# Patient Record
Sex: Female | Born: 1945 | Race: White | Hispanic: No | Smoking: Never smoker
Health system: Southern US, Community
[De-identification: ages and names within clinical notes are randomized; demographics above are authoritative.]

## PROBLEM LIST (undated history)

## (undated) DIAGNOSIS — K219 Gastro-esophageal reflux disease without esophagitis: Secondary | ICD-10-CM

## (undated) DIAGNOSIS — M4316 Spondylolisthesis, lumbar region: Secondary | ICD-10-CM

## (undated) DIAGNOSIS — R112 Nausea with vomiting, unspecified: Secondary | ICD-10-CM

## (undated) DIAGNOSIS — N8110 Cystocele, unspecified: Secondary | ICD-10-CM

## (undated) DIAGNOSIS — M1990 Unspecified osteoarthritis, unspecified site: Secondary | ICD-10-CM

## (undated) DIAGNOSIS — F419 Anxiety disorder, unspecified: Secondary | ICD-10-CM

## (undated) DIAGNOSIS — E7800 Pure hypercholesterolemia, unspecified: Secondary | ICD-10-CM

## (undated) DIAGNOSIS — I10 Essential (primary) hypertension: Secondary | ICD-10-CM

## (undated) DIAGNOSIS — Z9889 Other specified postprocedural states: Secondary | ICD-10-CM

## (undated) DIAGNOSIS — I2510 Atherosclerotic heart disease of native coronary artery without angina pectoris: Secondary | ICD-10-CM

## (undated) DIAGNOSIS — K8050 Calculus of bile duct without cholangitis or cholecystitis without obstruction: Secondary | ICD-10-CM

## (undated) HISTORY — PX: ERCP: SHX60

## (undated) HISTORY — PX: OTHER SURGICAL HISTORY: SHX169

## (undated) HISTORY — PX: LAMINECTOMY: SHX219

## (undated) HISTORY — PX: COLONOSCOPY: SHX174

## (undated) HISTORY — PX: ABDOMINAL HYSTERECTOMY: SHX81

## (undated) HISTORY — PX: ESOPHAGEAL DILATION: SHX303

## (undated) HISTORY — PX: TUBAL LIGATION: SHX77

## (undated) HISTORY — PX: VEIN LIGATION AND STRIPPING: SHX2653

## (undated) HISTORY — PX: BACK SURGERY: SHX140

## (undated) HISTORY — PX: ESOPHAGOGASTRODUODENOSCOPY: SHX1529

---

## 2001-10-26 ENCOUNTER — Inpatient Hospital Stay (HOSPITAL_COMMUNITY): Admission: EM | Admit: 2001-10-26 | Discharge: 2001-10-28 | Payer: Self-pay | Admitting: Cardiology

## 2005-12-28 ENCOUNTER — Ambulatory Visit: Payer: Self-pay | Admitting: Internal Medicine

## 2007-01-24 ENCOUNTER — Ambulatory Visit: Payer: Self-pay | Admitting: Internal Medicine

## 2008-01-25 ENCOUNTER — Ambulatory Visit: Payer: Self-pay | Admitting: Internal Medicine

## 2008-09-11 ENCOUNTER — Inpatient Hospital Stay: Payer: Self-pay | Admitting: Internal Medicine

## 2008-09-26 ENCOUNTER — Emergency Department: Payer: Self-pay | Admitting: Emergency Medicine

## 2008-09-27 ENCOUNTER — Ambulatory Visit: Payer: Self-pay | Admitting: Emergency Medicine

## 2008-10-09 ENCOUNTER — Inpatient Hospital Stay: Payer: Self-pay | Admitting: Surgery

## 2008-11-06 ENCOUNTER — Ambulatory Visit: Payer: Self-pay | Admitting: Internal Medicine

## 2008-11-30 ENCOUNTER — Inpatient Hospital Stay: Payer: Self-pay | Admitting: Internal Medicine

## 2009-01-03 ENCOUNTER — Inpatient Hospital Stay: Payer: Self-pay | Admitting: Internal Medicine

## 2009-01-13 ENCOUNTER — Inpatient Hospital Stay: Payer: Self-pay | Admitting: Internal Medicine

## 2009-01-13 ENCOUNTER — Ambulatory Visit: Payer: Self-pay | Admitting: Gastroenterology

## 2009-03-21 ENCOUNTER — Ambulatory Visit: Payer: Self-pay | Admitting: Internal Medicine

## 2009-11-05 ENCOUNTER — Ambulatory Visit: Payer: Self-pay | Admitting: Unknown Physician Specialty

## 2009-11-06 LAB — PATHOLOGY REPORT

## 2010-03-24 ENCOUNTER — Ambulatory Visit: Payer: Self-pay | Admitting: Internal Medicine

## 2010-03-27 ENCOUNTER — Ambulatory Visit: Payer: Self-pay | Admitting: Internal Medicine

## 2011-03-26 ENCOUNTER — Ambulatory Visit: Payer: Self-pay | Admitting: Internal Medicine

## 2012-03-27 ENCOUNTER — Ambulatory Visit: Payer: Self-pay | Admitting: Internal Medicine

## 2012-04-01 ENCOUNTER — Ambulatory Visit: Payer: Self-pay | Admitting: Physical Medicine and Rehabilitation

## 2012-04-01 LAB — CREATININE, SERUM
Creatinine: 0.85 mg/dL (ref 0.60–1.30)
EGFR (African American): >60

## 2013-03-28 ENCOUNTER — Ambulatory Visit: Payer: Self-pay | Admitting: Internal Medicine

## 2014-03-29 ENCOUNTER — Ambulatory Visit: Payer: Self-pay | Admitting: Internal Medicine

## 2014-12-06 ENCOUNTER — Encounter: Payer: Self-pay | Admitting: *Deleted

## 2014-12-09 ENCOUNTER — Ambulatory Visit: Payer: Medicare Other | Admitting: Anesthesiology

## 2014-12-09 ENCOUNTER — Ambulatory Visit
Admission: RE | Admit: 2014-12-09 | Discharge: 2014-12-09 | Disposition: A | Discharge status: Home / Self Care | Payer: Medicare Other | Source: Ambulatory Visit | Attending: Unknown Physician Specialty | Admitting: Unknown Physician Specialty

## 2014-12-09 ENCOUNTER — Encounter
Admission: RE | Disposition: A | Discharge status: Home / Self Care | Payer: Self-pay | Source: Ambulatory Visit | Attending: Unknown Physician Specialty

## 2014-12-09 ENCOUNTER — Encounter: Payer: Self-pay | Admitting: Anesthesiology

## 2014-12-09 DIAGNOSIS — K219 Gastro-esophageal reflux disease without esophagitis: Secondary | ICD-10-CM | POA: Insufficient documentation

## 2014-12-09 DIAGNOSIS — I2510 Atherosclerotic heart disease of native coronary artery without angina pectoris: Secondary | ICD-10-CM | POA: Diagnosis not present

## 2014-12-09 DIAGNOSIS — Z7982 Long term (current) use of aspirin: Secondary | ICD-10-CM | POA: Diagnosis not present

## 2014-12-09 DIAGNOSIS — D122 Benign neoplasm of ascending colon: Secondary | ICD-10-CM | POA: Diagnosis not present

## 2014-12-09 DIAGNOSIS — Z86010 Personal history of colonic polyps: Secondary | ICD-10-CM | POA: Insufficient documentation

## 2014-12-09 DIAGNOSIS — K640 First degree hemorrhoids: Secondary | ICD-10-CM | POA: Diagnosis not present

## 2014-12-09 DIAGNOSIS — Z90710 Acquired absence of both cervix and uterus: Secondary | ICD-10-CM | POA: Insufficient documentation

## 2014-12-09 DIAGNOSIS — E780 Pure hypercholesterolemia: Secondary | ICD-10-CM | POA: Diagnosis not present

## 2014-12-09 DIAGNOSIS — D123 Benign neoplasm of transverse colon: Secondary | ICD-10-CM | POA: Diagnosis not present

## 2014-12-09 DIAGNOSIS — N8110 Cystocele, unspecified: Secondary | ICD-10-CM | POA: Diagnosis not present

## 2014-12-09 DIAGNOSIS — K8050 Calculus of bile duct without cholangitis or cholecystitis without obstruction: Secondary | ICD-10-CM | POA: Diagnosis not present

## 2014-12-09 DIAGNOSIS — Z79899 Other long term (current) drug therapy: Secondary | ICD-10-CM | POA: Diagnosis not present

## 2014-12-09 DIAGNOSIS — I10 Essential (primary) hypertension: Secondary | ICD-10-CM | POA: Diagnosis not present

## 2014-12-09 DIAGNOSIS — F419 Anxiety disorder, unspecified: Secondary | ICD-10-CM | POA: Diagnosis not present

## 2014-12-09 HISTORY — PX: COLONOSCOPY WITH PROPOFOL: SHX5780

## 2014-12-09 HISTORY — DX: Pure hypercholesterolemia, unspecified: E78.00

## 2014-12-09 HISTORY — DX: Atherosclerotic heart disease of native coronary artery without angina pectoris: I25.10

## 2014-12-09 HISTORY — DX: Cystocele, unspecified: N81.10

## 2014-12-09 HISTORY — DX: Anxiety disorder, unspecified: F41.9

## 2014-12-09 HISTORY — DX: Essential (primary) hypertension: I10

## 2014-12-09 HISTORY — DX: Calculus of bile duct without cholangitis or cholecystitis without obstruction: K80.50

## 2014-12-09 HISTORY — DX: Gastro-esophageal reflux disease without esophagitis: K21.9

## 2014-12-09 SURGERY — COLONOSCOPY WITH PROPOFOL
Anesthesia: General

## 2014-12-09 MED ORDER — PROPOFOL INFUSION 10 MG/ML OPTIME
INTRAVENOUS | Status: DC | PRN
  Administered 2014-12-09: 160 ug/kg/min via INTRAVENOUS

## 2014-12-09 MED ORDER — FENTANYL CITRATE (PF) 100 MCG/2ML IJ SOLN
INTRAMUSCULAR | Status: DC | PRN
  Administered 2014-12-09: 50 ug via INTRAVENOUS

## 2014-12-09 MED ORDER — PROPOFOL 10 MG/ML IV BOLUS
INTRAVENOUS | Status: DC | PRN
  Administered 2014-12-09: 50 mg via INTRAVENOUS

## 2014-12-09 MED ORDER — SODIUM CHLORIDE 0.9 % IV SOLN: INTRAVENOUS | Status: DC

## 2014-12-09 MED ORDER — LIDOCAINE HCL (CARDIAC) 20 MG/ML IV SOLN
INTRAVENOUS | Status: DC | PRN
Start: 2014-12-09 — End: 2014-12-09
  Administered 2014-12-09: 30 mg via INTRAVENOUS

## 2014-12-09 MED ORDER — SODIUM CHLORIDE 0.9 % IV SOLN
INTRAVENOUS | Status: DC
  Administered 2014-12-09 (×2): via INTRAVENOUS

## 2014-12-09 MED ORDER — MIDAZOLAM HCL 5 MG/5ML IJ SOLN
INTRAMUSCULAR | Status: DC | PRN
  Administered 2014-12-09: 1 mg via INTRAVENOUS

## 2014-12-09 NOTE — Op Note (Signed)
[facility] Gastroenterology Patient Name: Michele Mcgee Procedure Date: 12/09/2014 2:18 PM MRN: [id number] Account #: [id number] Date of Birth: 1945/04/15 Admit Type: Outpatient Age: 69 Room: [facility] ENDO ROOM 1 Gender: Female Note Status: Finalized Procedure:         Colonoscopy Indications:       Personal history of colonic polyps Providers:         Manya Silvas, MD Referring MD:      Ocie Cornfield. Ouida Sills, MD (Referring MD) Medicines:         Propofol per Anesthesia Complications:     No immediate complications. Procedure:         Pre-Anesthesia Assessment:                    - After reviewing the risks and benefits, the patient was                     deemed in satisfactory condition to undergo the procedure.                    After obtaining informed consent, the colonoscope was                     passed under direct vision. Throughout the procedure, the                     patient's blood pressure, pulse, and oxygen saturations                     were monitored continuously. The Colonoscope was                     introduced through the anus and advanced to the the cecum,                     identified by appendiceal orifice and ileocecal valve. The                     colonoscopy was performed without difficulty. The patient                     tolerated the procedure well. The quality of the bowel                     preparation was good. Findings:      A small polyp was found in the ascending colon. The polyp was sessile.       The polyp was removed with a hot snare. Resection and retrieval were       complete. To prevent bleeding after the polypectomy, two hemostatic       clips were successfully placed.      A diminutive polyp was found in the ascending colon. The polyp was       sessile. The polyp was removed with a jumbo cold forceps. Resection and       retrieval were complete. Biopsies were taken with a cold forceps for       histology.  A small polyp was found at the hepatic flexure. The polyp was sessile.       The polyp was removed with a hot snare. Resection and retrieval were       complete.      A diminutive polyp was found in the transverse colon. The polyp was  sessile. Resection and retrieval were complete. Cold biopsy.      Internal hemorrhoids were found during endoscopy. The hemorrhoids were       small and Grade I (internal hemorrhoids that do not prolapse). Impression:        - One small polyp in the ascending colon. Resected and                     retrieved. Clips were placed.                    - One diminutive polyp in the ascending colon. Resected                     and retrieved. Biopsied.                    - One small polyp at the hepatic flexure. Resected and                     retrieved.                    - One diminutive polyp in the transverse colon. Resected                     and retrieved.                    - Internal hemorrhoids. Recommendation:    - Await pathology results. Manya Silvas, MD 12/09/2014 3:16:12 PM This report has been signed electronically. Number of Addenda: 0 Note Initiated On: 12/09/2014 2:18 PM Scope Withdrawal Time: 0 hours 23 minutes 20 seconds  Total Procedure Duration: 0 hours 28 minutes 0 seconds       [facility]

## 2014-12-09 NOTE — Anesthesia Postprocedure Evaluation (Signed)
  Anesthesia Post-op Note  Patient: Michele Mcgee  Procedure(s) Performed: Procedure(s): COLONOSCOPY WITH PROPOFOL (N/A)  Anesthesia type:General  Patient location: PACU  Post pain: Pain level controlled  Post assessment: Post-op Vital signs reviewed, Patient's Cardiovascular Status Stable, Respiratory Function Stable, Patent Airway and No signs of Nausea or vomiting  Post vital signs: Reviewed and stable  Last Vitals:  Filed Vitals:   12/09/14 1600  BP: 134/69  Pulse: 62  Temp:   Resp: 18    Level of consciousness: awake, alert  and patient cooperative  Complications: No apparent anesthesia complications

## 2014-12-09 NOTE — Anesthesia Preprocedure Evaluation (Addendum)
Anesthesia Evaluation  Patient identified by MRN, date of birth, ID band Patient awake    Reviewed: Allergy & Precautions, H&P , NPO status , Patient's Chart, lab work & pertinent test results, reviewed documented beta blocker date and time   Airway Mallampati: III  TM Distance: >3 FB Neck ROM: full    Dental no notable dental hx.    Pulmonary neg pulmonary ROS,  breath sounds clear to auscultation  Pulmonary exam normal       Cardiovascular Exercise Tolerance: Good hypertension, Pt. on medications and Pt. on home beta blockers + CAD negative cardio ROS  Rhythm:regular Rate:Normal     Neuro/Psych Anxiety negative neurological ROS  negative psych ROS   GI/Hepatic negative GI ROS, Neg liver ROS, GERD-  Medicated and Controlled,  Endo/Other  negative endocrine ROS  Renal/GU negative Renal ROS  negative genitourinary   Musculoskeletal   Abdominal   Peds  Hematology negative hematology ROS (+)   Anesthesia Other Findings Obesity.  Reproductive/Obstetrics negative OB ROS                          Anesthesia Physical Anesthesia Plan  ASA: III  Anesthesia Plan: General   Post-op Pain Management:    Induction:   Airway Management Planned: Nasal Cannula  Additional Equipment:   Intra-op Plan:   Post-operative Plan:   Informed Consent: I have reviewed the patients History and Physical, chart, labs and discussed the procedure including the risks, benefits and alternatives for the proposed anesthesia with the patient or authorized representative who has indicated his/her understanding and acceptance.   Dental Advisory Given  Plan Discussed with: CRNA  Anesthesia Plan Comments:        Anesthesia Quick Evaluation

## 2014-12-09 NOTE — Transfer of Care (Signed)
Immediate Anesthesia Transfer of Care Note  Patient: Michele Mcgee  Procedure(s) Performed: Procedure(s): COLONOSCOPY WITH PROPOFOL (N/A)  Patient Location: PACU and Short Stay  Anesthesia Type:General  Level of Consciousness: awake, oriented and patient cooperative  Airway & Oxygen Therapy: Patient Spontanous Breathing and Patient connected to nasal cannula oxygen  Post-op Assessment: Report given to RN and Post -op Vital signs reviewed and stable  Post vital signs: Reviewed and stable  Last Vitals:  Filed Vitals:   12/09/14 1518  BP: 0/0  Pulse: 73  Temp: 36.6 C  Resp:     Complications: No apparent anesthesia complications

## 2014-12-09 NOTE — H&P (Signed)
   Primary Care Physician:  Kirk Ruths., MD Primary Gastroenterologist:  Dr. Vira Agar  Pre-Procedure History & Physical: HPI:  Michele Mcgee is a 69 y.o. female is here for an colonoscopy.   Past Medical History  Diagnosis Date  . Hypercholesterolemia   . GERD (gastroesophageal reflux disease)   . Female bladder prolapse   . Coronary artery disease   . Choledocholithiasis   . Anxiety   . Hypertension     Past Surgical History  Procedure Laterality Date  . Abdominal hysterectomy    . Back surgery    . Esophageal dilation    . Laminectomy    . Vein ligation and stripping    . Bladder tacking surgery    . Colonoscopy    . Esophagogastroduodenoscopy    . Ercp      Prior to Admission medications   Medication Sig Start Date End Date Taking? Authorizing Provider  ALPRAZolam Duanne Moron) 0.25 MG tablet Take 0.25 mg by mouth at bedtime as needed for anxiety.   Yes Historical Provider, MD  aspirin 81 MG tablet Take 81 mg by mouth daily.   Yes Historical Provider, MD  escitalopram (LEXAPRO) 10 MG tablet Take 10 mg by mouth daily.   Yes Historical Provider, MD  esomeprazole (NEXIUM) 40 MG capsule Take 40 mg by mouth daily at 12 noon.   Yes Historical Provider, MD  lisinopril-hydrochlorothiazide (PRINZIDE,ZESTORETIC) 10-12.5 MG per tablet Take 1 tablet by mouth daily.   Yes Historical Provider, MD  metoprolol succinate (TOPROL-XL) 25 MG 24 hr tablet Take 25 mg by mouth daily.   Yes Historical Provider, MD    Allergies as of 11/11/2014  . (Not on File)    History reviewed. No pertinent family history.  Social History   Social History  . Marital Status: Widowed    Spouse Name: N/A  . Number of Children: N/A  . Years of Education: N/A   Occupational History  . Not on file.   Social History Main Topics  . Smoking status: Not on file  . Smokeless tobacco: Not on file  . Alcohol Use: Not on file  . Drug Use: Not on file  . Sexual Activity: Not on file   Other Topics  Concern  . Not on file   Social History Narrative    Review of Systems: See HPI, otherwise negative ROS  Physical Exam: BP 165/68 mmHg  Pulse 83  Temp(Src) 98.5 F (36.9 C) (Oral)  Resp 19  Ht 5\' 2"  (1.575 m)  Wt 107.049 kg (236 lb)  BMI 43.15 kg/m2  SpO2 98% General:   Alert,  pleasant and cooperative in NAD Head:  Normocephalic and atraumatic. Neck:  Supple; no masses or thyromegaly. Lungs:  Clear throughout to auscultation.    Heart:  Regular rate and rhythm. Abdomen:  Soft, nontender and nondistended. Normal bowel sounds, without guarding, and without rebound.   Neurologic:  Alert and  oriented x4;  grossly normal neurologically.  Impression/Plan: Michele Mcgee is here for an colonoscopy to be performed for Personal hx of colon polyps  Risks, benefits, limitations, and alternatives regarding  colonoscopy have been reviewed with the patient.  Questions have been answered.  All parties agreeable.   Gaylyn Cheers, MD  12/09/2014, 2:32 PM

## 2014-12-10 ENCOUNTER — Encounter: Payer: Self-pay | Admitting: Unknown Physician Specialty

## 2014-12-11 LAB — SURGICAL PATHOLOGY

## 2015-02-18 ENCOUNTER — Other Ambulatory Visit: Payer: Self-pay | Admitting: Internal Medicine

## 2015-02-18 DIAGNOSIS — Z1231 Encounter for screening mammogram for malignant neoplasm of breast: Secondary | ICD-10-CM

## 2015-03-31 ENCOUNTER — Ambulatory Visit
Admission: RE | Admit: 2015-03-31 | Discharge: 2015-03-31 | Disposition: A | Discharge status: Home / Self Care | Payer: Medicare Other | Source: Ambulatory Visit | Attending: Internal Medicine | Admitting: Internal Medicine

## 2015-03-31 DIAGNOSIS — Z1231 Encounter for screening mammogram for malignant neoplasm of breast: Secondary | ICD-10-CM | POA: Diagnosis present

## 2016-02-16 ENCOUNTER — Other Ambulatory Visit: Payer: Self-pay | Admitting: Internal Medicine

## 2016-02-16 DIAGNOSIS — Z1231 Encounter for screening mammogram for malignant neoplasm of breast: Secondary | ICD-10-CM

## 2016-03-31 ENCOUNTER — Ambulatory Visit: Payer: Medicare Other

## 2016-09-16 ENCOUNTER — Other Ambulatory Visit: Payer: Self-pay | Admitting: Physician Assistant

## 2016-09-16 ENCOUNTER — Ambulatory Visit
Admission: RE | Admit: 2016-09-16 | Discharge: 2016-09-16 | Disposition: A | Discharge status: Home / Self Care | Payer: Medicare Other | Source: Ambulatory Visit | Attending: Physician Assistant | Admitting: Physician Assistant

## 2016-09-16 DIAGNOSIS — M7989 Other specified soft tissue disorders: Secondary | ICD-10-CM | POA: Diagnosis not present

## 2016-09-16 DIAGNOSIS — R0602 Shortness of breath: Secondary | ICD-10-CM

## 2016-09-16 DIAGNOSIS — R600 Localized edema: Secondary | ICD-10-CM

## 2017-01-11 ENCOUNTER — Ambulatory Visit
Admission: RE | Admit: 2017-01-11 | Discharge: 2017-01-11 | Disposition: A | Discharge status: Home / Self Care | Payer: Medicare Other | Source: Ambulatory Visit | Attending: Internal Medicine | Admitting: Internal Medicine

## 2017-01-11 DIAGNOSIS — Z1231 Encounter for screening mammogram for malignant neoplasm of breast: Secondary | ICD-10-CM | POA: Diagnosis not present

## 2017-05-19 ENCOUNTER — Other Ambulatory Visit: Payer: Self-pay | Admitting: Orthopedic Surgery

## 2017-05-19 DIAGNOSIS — M48061 Spinal stenosis, lumbar region without neurogenic claudication: Secondary | ICD-10-CM

## 2017-05-20 ENCOUNTER — Ambulatory Visit
Admission: RE | Admit: 2017-05-20 | Discharge: 2017-05-20 | Disposition: A | Discharge status: Home / Self Care | Payer: Medicare Other | Source: Ambulatory Visit | Attending: Orthopedic Surgery | Admitting: Orthopedic Surgery

## 2017-05-20 DIAGNOSIS — M48061 Spinal stenosis, lumbar region without neurogenic claudication: Secondary | ICD-10-CM

## 2017-08-30 ENCOUNTER — Other Ambulatory Visit: Payer: Self-pay | Admitting: Neurosurgery

## 2017-09-08 NOTE — Pre-Procedure Instructions (Addendum)
ALLEY NEILS  09/08/2017      [id number] - [place], [place] - [address] [place] [address] Phone: [phone] Fax: [phone]    Your procedure is scheduled on Wed., September 14, 2017 from 10:30AM-2:18PM  Report to [facility] Admitting Entrance "A" at 8:30AM  Call this number if you have problems the morning of surgery:  [phone]   Remember:  No food or drinks after midnight on June 4th  Take these medicines the morning of surgery with A SIP OF WATER: Docusate sodium (COLACE), Escitalopram (LEXAPRO), Esomeprazole ([place]), Metoprolol succinate (TOPROL-XL), and Fluticasone (FLONASE).  Follow your doctors instructions regarding your Aspirin.  If no instructions were given by your doctor, then you will need to call the prescribing office office to get instructions.  As of today, stop taking all Other Aspirin Products, Vitamins, Fish oils, and Herbal medications. Also stop all NSAIDS i.e. Advil, Ibuprofen, Motrin, Aleve, Anaprox, Naproxen, BC and Goody Powders.    Do not wear jewelry, make-up or nail polish (fingers).  Do not wear lotions, powders, or perfumes, or deodorant.  Do not shave 48 hours prior to surgery.    Do not bring valuables to the hospital.  [facility] is not responsible for any belongings or valuables.  Contacts, dentures or bridgework may not be worn into surgery.  Leave your suitcase in the car.  After surgery it may be brought to your room.  For patients admitted to the hospital, discharge time will be determined by your treatment team.  Patients discharged the day of surgery will not be allowed to drive home.   Special instructions:   [place]- Preparing For Surgery  Before surgery, you can play an important role. Because skin is not sterile, your skin needs to be as free of germs as possible. You can reduce the number of germs on your skin by washing with CHG (chlorahexidine gluconate) Soap before surgery.   CHG is an antiseptic cleaner which kills germs and bonds with the skin to continue killing germs even after washing.    Oral Hygiene is also important to reduce your risk of infection.  Remember - BRUSH YOUR TEETH THE MORNING OF SURGERY WITH YOUR REGULAR TOOTHPASTE  Please do not use if you have an allergy to CHG or antibacterial soaps. If your skin becomes reddened/irritated stop using the CHG.  Do not shave (including legs and underarms) for at least 48 hours prior to first CHG shower. It is OK to shave your face.  Please follow these instructions carefully.   1. Shower the NIGHT BEFORE SURGERY and the MORNING OF SURGERY with CHG.   2. If you chose to wash your hair, wash your hair first as usual with your normal shampoo.  3. After you shampoo, rinse your hair and body thoroughly to remove the shampoo.  4. Use CHG as you would any other liquid soap. You can apply CHG directly to the skin and wash gently with a scrungie or a clean washcloth.   5. Apply the CHG Soap to your body ONLY FROM THE NECK DOWN.  Do not use on open wounds or open sores. Avoid contact with your eyes, ears, mouth and genitals (private parts). Wash Face and genitals (private parts)  with your normal soap.  6. Wash thoroughly, paying special attention to the area where your surgery will be performed.  7. Thoroughly rinse your body with warm water from the neck down.  8.  DO NOT shower/wash with your normal soap after using and rinsing off the CHG Soap.  9. Pat yourself dry with a CLEAN TOWEL.  10. Wear CLEAN PAJAMAS to bed the night before surgery, wear comfortable clothes the morning of surgery  11. Place CLEAN SHEETS on your bed the night of your first shower and DO NOT SLEEP WITH PETS.  Day of Surgery:  Do not apply any deodorants/lotions.  Please wear clean clothes to the hospital/surgery center.   Remember to brush your teeth WITH YOUR REGULAR TOOTHPASTE.  Please read over the following fact sheets that  you were given. Pain Booklet, Coughing and Deep Breathing, MRSA Information and Surgical Site Infection Prevention

## 2017-09-09 ENCOUNTER — Other Ambulatory Visit: Payer: Self-pay

## 2017-09-09 ENCOUNTER — Encounter (HOSPITAL_COMMUNITY)
Admission: RE | Admit: 2017-09-09 | Discharge: 2017-09-09 | Disposition: A | Discharge status: Home / Self Care | Payer: Medicare Other | Source: Ambulatory Visit | Attending: Neurosurgery | Admitting: Neurosurgery

## 2017-09-09 ENCOUNTER — Other Ambulatory Visit: Payer: Self-pay | Admitting: Neurosurgery

## 2017-09-09 ENCOUNTER — Encounter (HOSPITAL_COMMUNITY): Payer: Self-pay

## 2017-09-09 DIAGNOSIS — Z01812 Encounter for preprocedural laboratory examination: Secondary | ICD-10-CM | POA: Insufficient documentation

## 2017-09-09 HISTORY — DX: Spondylolisthesis, lumbar region: M43.16

## 2017-09-09 HISTORY — DX: Unspecified osteoarthritis, unspecified site: M19.90

## 2017-09-09 HISTORY — DX: Other specified postprocedural states: Z98.890

## 2017-09-09 HISTORY — DX: Nausea with vomiting, unspecified: R11.2

## 2017-09-09 LAB — CBC
HEMATOCRIT: 41.4 % (ref 36.0–46.0)
HEMOGLOBIN: 13.9 g/dL (ref 12.0–15.0)
MCH: 30.8 pg (ref 26.0–34.0)
MCHC: 33.6 g/dL (ref 30.0–36.0)
MCV: 91.6 fL (ref 78.0–100.0)
Platelets: 151 10*3/uL (ref 150–400)
RBC: 4.52 MIL/uL (ref 3.87–5.11)
RDW: 13.1 % (ref 11.5–15.5)
WBC: 5.5 10*3/uL (ref 4.0–10.5)

## 2017-09-09 LAB — BASIC METABOLIC PANEL
Anion gap: 6 (ref 5–15)
BUN: 15 mg/dL (ref 6–20)
CALCIUM: 8.9 mg/dL (ref 8.9–10.3)
CHLORIDE: 104 mmol/L (ref 101–111)
CO2: 31 mmol/L (ref 22–32)
Creatinine, Ser: 0.82 mg/dL (ref 0.44–1.00)
GFR calc Af Amer: >60 mL/min (ref >60)
GFR calc non Af Amer: >60 mL/min (ref >60)
GLUCOSE: 139 mg/dL — AB (ref 65–99)
POTASSIUM: 4 mmol/L (ref 3.5–5.1)
SODIUM: 141 mmol/L (ref 135–145)

## 2017-09-09 LAB — SURGICAL PCR SCREEN
MRSA, PCR: NEGATIVE
STAPHYLOCOCCUS AUREUS: NEGATIVE

## 2017-09-09 LAB — TYPE AND SCREEN
ABO/RH(D): A POS
Antibody Screen: NEGATIVE

## 2017-09-09 LAB — ABO/RH: ABO/RH(D): A POS

## 2017-09-09 NOTE — Progress Notes (Addendum)
PCP - Dr. Frazier Richards  Cardiologist - Dr. Ubaldo Glassing- Clearance note in chart  Chest x-ray - 09/16/16 (CE)  EKG - 09/21/16 (CE)-Requested  Stress Test - 09/30/16 (CE)  ECHO - 09/23/16 (CE)  Cardiac Cath - 2003- Stent x1  Sleep Study - Denies CPAP - None  LABS- 09/09/17: CBC, BMP, T/S  ASA- LD-5/30   Anesthesia- Yes- Cardiac history  Pt denies having chest pain, sob, or fever at this time. All instructions explained to the pt, with a verbal understanding of the material. Pt agrees to go over the instructions while at home for a better understanding. The opportunity to ask questions was provided.

## 2017-09-12 ENCOUNTER — Encounter (HOSPITAL_COMMUNITY): Payer: Self-pay

## 2017-09-12 NOTE — Progress Notes (Signed)
Anesthesia Chart Review:  Case:  [id number] Date/Time:  09/14/17 1015   Procedure:  POSTERIOR LUMBAR INTERBODY FUSION, INTERBODY PROSTHESIS, POSYTERIOR INSTRUMENTATION AND FUSION LUMBAR 3- LUMBAR 4, LUMBAR 2- LUMBAR 3 LAMINOTOMIES (N/A ) - POSTERIOR LUMBAR INTERBODY FUSION, INTERBODY PROSTHESIS, POSYTERIOR INSTRUMENTATION AND FUSION LUMBAR 3- LUMBAR 4, LUMBAR 2- LUMBAR 3 LAMINOTOMIES   Anesthesia type:  General   Pre-op diagnosis:  SPONDYLOLISTHESIS, LUMBAR REGION   Location:  [facility] OR ROOM 19 / [facility]   Surgeon:  Newman Pies, MD      DISCUSSION: Patient is a 72 year old female scheduled for the above procedure. History includes never smoker, post-operative N/V, CAD, MI '03 (s/p LAD DES).  She had a stress and echo within the past year (see below) and has cardiology clearance. If no acute changes then I anticipate that she can proceed as planned.   VS: BP (!) 152/73   Pulse 84   Temp 36.7 C   Resp 20   Ht 4\' 9"  (1.448 m)   Wt 249 lb 9.6 oz (113.2 kg)   SpO2 95%   BMI 54.01 kg/m   PROVIDERS: Kirk Ruths, MD is PCP [facility], [place]). Bartholome Bill, MD is cardiologist [facility], [facility]). Last visit 09/21/16 for follow-up CAD and edema. Stress and echo were ordered (see below).   LABS: Labs reviewed: Acceptable for surgery. (all labs ordered are listed, but only abnormal results are displayed)  Labs Reviewed  BASIC METABOLIC PANEL - Abnormal; Notable for the following components:      Result Value   Glucose, Bld 139 (*)    All other components within normal limits  SURGICAL PCR SCREEN  CBC  TYPE AND SCREEN  ABO/RH    EKG: 09/21/16 EKG [facility]): NSR.   CV: Nuclear stress 09/30/16 ([place]): LVEF= 68% FINDINGS: Regional wall motion:reveals normal myocardial thickening and wall  motion. The overall quality of the study is good. Artifacts noted: no Left ventricular cavity: normal. Perfusion  Analysis:SPECT images demonstrate homogeneous tracer  distribution throughout the myocardium. RESULT IMPRESSION:  Negative ETT.LV function normal.No evidence of ischemia.  Echo 09/23/16 ([place]):  INTERPRETATION NORMAL LEFT VENTRICULAR SYSTOLIC FUNCTION NORMAL RIGHT VENTRICULAR SYSTOLIC FUNCTION MILD VALVULAR REGURGITATION (See above) NO VALVULAR STENOSIS Closest EF: >55% (Estimated) Mitral: TRIVIAL MR Tricuspid: MILD TR Aortic: TRIVIAL AR  According to 09/21/16 office note by Dr. Ubaldo Glassing (Care Everywhere), "She has a history of coronary artery disease status post myocardial infarction in 2003 with cardiac catheterization done at [facility] showing ejection fraction of 44% with a 90% proximal LAD, 90% mid LAD, insignificant disease in the right coronary and circumflex. Underwent PCI with placement of a 3.0 x 23 mm Cypher drug-eluting stent." (I could not locate a cath report in [facility] Epic.)    Past Medical History:  Diagnosis Date  . Anxiety   . Arthritis   . Choledocholithiasis   . Complication of anesthesia   . Coronary artery disease   . Female bladder prolapse   . GERD (gastroesophageal reflux disease)   . Hypercholesterolemia   . Hypertension   . PONV (postoperative nausea and vomiting)   . Spondylolisthesis of lumbar region     Past Surgical History:  Procedure Laterality Date  . ABDOMINAL HYSTERECTOMY    . BACK SURGERY    . Bladder Tacking Surgery    . COLONOSCOPY    . COLONOSCOPY WITH PROPOFOL N/A 12/09/2014   Procedure: COLONOSCOPY WITH PROPOFOL;  Surgeon:  Manya Silvas, MD;  Location: [facility] ENDOSCOPY;  Service: Endoscopy;  Laterality: N/A;  . ERCP    . ESOPHAGEAL DILATION    . ESOPHAGOGASTRODUODENOSCOPY    . LAMINECTOMY    . TUBAL LIGATION    . VEIN LIGATION AND STRIPPING      MEDICATIONS: . aspirin ([place] ASPIRIN) 81 MG EC tablet  . atorvastatin (LIPITOR) 40 MG tablet  . Calcium Carb-Cholecalciferol (CALCIUM 600+D3 PO)  .  docusate sodium (COLACE) 100 MG capsule  . escitalopram (LEXAPRO) 20 MG tablet  . esomeprazole (NEXIUM) 40 MG capsule  . fluticasone (FLONASE) 50 MCG/ACT nasal spray  . lisinopril-hydrochlorothiazide (PRINZIDE,ZESTORETIC) 10-12.5 MG per tablet  . metoprolol succinate (TOPROL-XL) 25 MG 24 hr tablet  . Multiple Vitamin (MULTIVITAMIN WITH MINERALS) TABS tablet  . torsemide (DEMADEX) 20 MG tablet   No current facility-administered medications for this encounter.   Last ASA 09/08/17.  George Hugh [facility] Short Stay Center/Anesthesiology Phone [phone] 09/12/2017 11:19 AM

## 2017-09-13 MED ORDER — SODIUM CHLORIDE 0.9 % IV SOLN
1500.0000 mg | INTRAVENOUS | Status: AC
  Administered 2017-09-14: 1500 mg via INTRAVENOUS
  Filled 2017-09-13: qty 1500

## 2017-09-14 ENCOUNTER — Encounter (HOSPITAL_COMMUNITY)
Admission: RE | Disposition: A | Discharge status: Home Health | Payer: Self-pay | Source: Ambulatory Visit | Attending: Neurosurgery

## 2017-09-14 ENCOUNTER — Inpatient Hospital Stay (HOSPITAL_COMMUNITY)
Admission: RE | Admit: 2017-09-14 | Discharge: 2017-09-15 | DRG: 454 | Disposition: A | Discharge status: Home Health | Payer: Medicare Other | Source: Ambulatory Visit | Attending: Neurosurgery | Admitting: Neurosurgery

## 2017-09-14 ENCOUNTER — Inpatient Hospital Stay (HOSPITAL_COMMUNITY): Payer: Medicare Other

## 2017-09-14 ENCOUNTER — Inpatient Hospital Stay (HOSPITAL_COMMUNITY): Payer: Medicare Other | Admitting: Emergency Medicine

## 2017-09-14 ENCOUNTER — Inpatient Hospital Stay (HOSPITAL_COMMUNITY): Payer: Medicare Other | Admitting: Anesthesiology

## 2017-09-14 ENCOUNTER — Encounter (HOSPITAL_COMMUNITY): Payer: Self-pay | Admitting: Urology

## 2017-09-14 DIAGNOSIS — F419 Anxiety disorder, unspecified: Secondary | ICD-10-CM | POA: Diagnosis present

## 2017-09-14 DIAGNOSIS — E7800 Pure hypercholesterolemia, unspecified: Secondary | ICD-10-CM | POA: Diagnosis present

## 2017-09-14 DIAGNOSIS — M4316 Spondylolisthesis, lumbar region: Principal | ICD-10-CM | POA: Diagnosis present

## 2017-09-14 DIAGNOSIS — I10 Essential (primary) hypertension: Secondary | ICD-10-CM | POA: Diagnosis present

## 2017-09-14 DIAGNOSIS — M5116 Intervertebral disc disorders with radiculopathy, lumbar region: Secondary | ICD-10-CM | POA: Diagnosis present

## 2017-09-14 DIAGNOSIS — I2510 Atherosclerotic heart disease of native coronary artery without angina pectoris: Secondary | ICD-10-CM | POA: Diagnosis present

## 2017-09-14 DIAGNOSIS — E669 Obesity, unspecified: Secondary | ICD-10-CM | POA: Diagnosis present

## 2017-09-14 DIAGNOSIS — Z6841 Body Mass Index (BMI) 40.0 and over, adult: Secondary | ICD-10-CM | POA: Diagnosis not present

## 2017-09-14 DIAGNOSIS — Z79899 Other long term (current) drug therapy: Secondary | ICD-10-CM | POA: Diagnosis not present

## 2017-09-14 DIAGNOSIS — Z7982 Long term (current) use of aspirin: Secondary | ICD-10-CM

## 2017-09-14 DIAGNOSIS — K219 Gastro-esophageal reflux disease without esophagitis: Secondary | ICD-10-CM | POA: Diagnosis present

## 2017-09-14 DIAGNOSIS — M48062 Spinal stenosis, lumbar region with neurogenic claudication: Secondary | ICD-10-CM | POA: Diagnosis present

## 2017-09-14 DIAGNOSIS — Z419 Encounter for procedure for purposes other than remedying health state, unspecified: Secondary | ICD-10-CM

## 2017-09-14 HISTORY — PX: LUMBAR DISC SURGERY: SHX700

## 2017-09-14 SURGERY — POSTERIOR LUMBAR FUSION 1 LEVEL
Anesthesia: General

## 2017-09-14 MED ORDER — SUCCINYLCHOLINE CHLORIDE 20 MG/ML IJ SOLN
INTRAMUSCULAR | Status: DC | PRN
  Administered 2017-09-14: 120 mg via INTRAVENOUS

## 2017-09-14 MED ORDER — OXYCODONE HCL 5 MG PO TABS
ORAL_TABLET | ORAL | Status: AC
  Administered 2017-09-14: 5 mg via ORAL
  Filled 2017-09-14: qty 1

## 2017-09-14 MED ORDER — HYDROCHLOROTHIAZIDE 12.5 MG PO CAPS
12.5000 mg | ORAL_CAPSULE | Freq: Every day | ORAL | Status: DC
  Administered 2017-09-14: 12.5 mg via ORAL
  Filled 2017-09-14: qty 1

## 2017-09-14 MED ORDER — ONDANSETRON HCL 4 MG PO TABS: 4.0000 mg | ORAL_TABLET | Freq: Four times a day (QID) | ORAL | Status: DC | PRN

## 2017-09-14 MED ORDER — THROMBIN 5000 UNITS EX SOLR
CUTANEOUS | Status: AC
  Filled 2017-09-14: qty 5000

## 2017-09-14 MED ORDER — PROPOFOL 10 MG/ML IV BOLUS
INTRAVENOUS | Status: DC | PRN
  Administered 2017-09-14: 140 mg via INTRAVENOUS

## 2017-09-14 MED ORDER — CYCLOBENZAPRINE HCL 10 MG PO TABS: 10.0000 mg | ORAL_TABLET | Freq: Three times a day (TID) | ORAL | Status: DC | PRN

## 2017-09-14 MED ORDER — DEXAMETHASONE SODIUM PHOSPHATE 10 MG/ML IJ SOLN
INTRAMUSCULAR | Status: AC
  Filled 2017-09-14: qty 1

## 2017-09-14 MED ORDER — BACITRACIN ZINC 500 UNIT/GM EX OINT
TOPICAL_OINTMENT | CUTANEOUS | Status: AC
  Filled 2017-09-14: qty 28.35

## 2017-09-14 MED ORDER — ZOLPIDEM TARTRATE 5 MG PO TABS: 5.0000 mg | ORAL_TABLET | Freq: Every evening | ORAL | Status: DC | PRN

## 2017-09-14 MED ORDER — HYDROMORPHONE HCL 2 MG/ML IJ SOLN
0.2500 mg | INTRAMUSCULAR | Status: DC | PRN
  Administered 2017-09-14 (×2): 0.5 mg via INTRAVENOUS

## 2017-09-14 MED ORDER — FENTANYL CITRATE (PF) 250 MCG/5ML IJ SOLN
INTRAMUSCULAR | Status: AC
  Filled 2017-09-14: qty 5

## 2017-09-14 MED ORDER — FLUTICASONE PROPIONATE 50 MCG/ACT NA SUSP: 1.0000 | Freq: Every day | NASAL | Status: DC | PRN

## 2017-09-14 MED ORDER — PROPOFOL 10 MG/ML IV BOLUS
INTRAVENOUS | Status: AC
  Filled 2017-09-14: qty 20

## 2017-09-14 MED ORDER — DOCUSATE SODIUM 100 MG PO CAPS
100.0000 mg | ORAL_CAPSULE | Freq: Two times a day (BID) | ORAL | Status: DC
  Filled 2017-09-14: qty 1

## 2017-09-14 MED ORDER — VANCOMYCIN HCL 1000 MG IV SOLR
INTRAVENOUS | Status: AC
  Filled 2017-09-14: qty 1000

## 2017-09-14 MED ORDER — SODIUM CHLORIDE 0.9% FLUSH: 3.0000 mL | INTRAVENOUS | Status: DC | PRN

## 2017-09-14 MED ORDER — SUFENTANIL CITRATE 250 MCG/5ML IV SOLN
0.2500 ug/kg/h | INTRAVENOUS | Status: AC
  Administered 2017-09-14: .1 ug/kg/h via INTRAVENOUS
  Filled 2017-09-14: qty 5

## 2017-09-14 MED ORDER — LISINOPRIL-HYDROCHLOROTHIAZIDE 10-12.5 MG PO TABS: 1.0000 | ORAL_TABLET | Freq: Every day | ORAL | Status: DC

## 2017-09-14 MED ORDER — OXYCODONE HCL 5 MG PO TABS
10.0000 mg | ORAL_TABLET | ORAL | Status: DC | PRN
  Administered 2017-09-14 – 2017-09-15 (×5): 10 mg via ORAL
  Filled 2017-09-14 (×5): qty 2

## 2017-09-14 MED ORDER — PHENYLEPHRINE 40 MCG/ML (10ML) SYRINGE FOR IV PUSH (FOR BLOOD PRESSURE SUPPORT)
PREFILLED_SYRINGE | INTRAVENOUS | Status: DC | PRN
  Administered 2017-09-14 (×2): 80 ug via INTRAVENOUS

## 2017-09-14 MED ORDER — ACETAMINOPHEN 325 MG PO TABS
650.0000 mg | ORAL_TABLET | ORAL | Status: DC | PRN
Start: 2017-09-14 — End: 2017-09-15

## 2017-09-14 MED ORDER — BUPIVACAINE LIPOSOME 1.3 % IJ SUSP
20.0000 mL | INTRAMUSCULAR | Status: AC
  Administered 2017-09-14: 20 mL
  Filled 2017-09-14: qty 20

## 2017-09-14 MED ORDER — ROCURONIUM BROMIDE 10 MG/ML (PF) SYRINGE
PREFILLED_SYRINGE | INTRAVENOUS | Status: AC
  Filled 2017-09-14: qty 10

## 2017-09-14 MED ORDER — ESCITALOPRAM OXALATE 20 MG PO TABS
20.0000 mg | ORAL_TABLET | Freq: Every day | ORAL | Status: DC
  Administered 2017-09-15: 20 mg via ORAL
  Filled 2017-09-14: qty 1

## 2017-09-14 MED ORDER — BACITRACIN 50000 UNITS IM SOLR
INTRAMUSCULAR | Status: DC | PRN
  Administered 2017-09-14: 12:00:00

## 2017-09-14 MED ORDER — ACETAMINOPHEN 500 MG PO TABS
1000.0000 mg | ORAL_TABLET | Freq: Four times a day (QID) | ORAL | Status: AC
  Administered 2017-09-14 – 2017-09-15 (×4): 1000 mg via ORAL
  Filled 2017-09-14 (×4): qty 2

## 2017-09-14 MED ORDER — HYDROMORPHONE HCL 2 MG/ML IJ SOLN
INTRAMUSCULAR | Status: AC
  Administered 2017-09-14: 0.5 mg via INTRAVENOUS
  Filled 2017-09-14: qty 1

## 2017-09-14 MED ORDER — BUPIVACAINE-EPINEPHRINE (PF) 0.5% -1:200000 IJ SOLN
INTRAMUSCULAR | Status: AC
  Filled 2017-09-14: qty 30

## 2017-09-14 MED ORDER — METOPROLOL SUCCINATE ER 25 MG PO TB24
25.0000 mg | ORAL_TABLET | Freq: Every day | ORAL | Status: DC
  Filled 2017-09-14: qty 1

## 2017-09-14 MED ORDER — PHENYLEPHRINE HCL 10 MG/ML IJ SOLN
INTRAVENOUS | Status: DC | PRN
  Administered 2017-09-14: 10 ug/min via INTRAVENOUS

## 2017-09-14 MED ORDER — SUGAMMADEX SODIUM 200 MG/2ML IV SOLN
INTRAVENOUS | Status: DC | PRN
  Administered 2017-09-14: 200 mg via INTRAVENOUS

## 2017-09-14 MED ORDER — ATORVASTATIN CALCIUM 20 MG PO TABS
40.0000 mg | ORAL_TABLET | Freq: Every evening | ORAL | Status: DC
  Administered 2017-09-14: 40 mg via ORAL
  Filled 2017-09-14: qty 2

## 2017-09-14 MED ORDER — LACTATED RINGERS IV SOLN
INTRAVENOUS | Status: DC
Start: 2017-09-14 — End: 2017-09-15
  Administered 2017-09-14: 09:00:00 via INTRAVENOUS

## 2017-09-14 MED ORDER — BISACODYL 10 MG RE SUPP: 10.0000 mg | Freq: Every day | RECTAL | Status: DC | PRN

## 2017-09-14 MED ORDER — ACETAMINOPHEN 650 MG RE SUPP: 650.0000 mg | RECTAL | Status: DC | PRN

## 2017-09-14 MED ORDER — OXYCODONE HCL 5 MG PO TABS
5.0000 mg | ORAL_TABLET | ORAL | Status: DC | PRN
  Administered 2017-09-14: 5 mg via ORAL

## 2017-09-14 MED ORDER — LIDOCAINE 2% (20 MG/ML) 5 ML SYRINGE
INTRAMUSCULAR | Status: AC
  Filled 2017-09-14: qty 5

## 2017-09-14 MED ORDER — PANTOPRAZOLE SODIUM 40 MG PO TBEC
80.0000 mg | DELAYED_RELEASE_TABLET | Freq: Every day | ORAL | Status: DC
  Administered 2017-09-15: 80 mg via ORAL
  Filled 2017-09-14: qty 2

## 2017-09-14 MED ORDER — MEPERIDINE HCL 50 MG/ML IJ SOLN: 6.2500 mg | INTRAMUSCULAR | Status: DC | PRN

## 2017-09-14 MED ORDER — MENTHOL 3 MG MT LOZG: 1.0000 | LOZENGE | OROMUCOSAL | Status: DC | PRN

## 2017-09-14 MED ORDER — DEXAMETHASONE SODIUM PHOSPHATE 10 MG/ML IJ SOLN
INTRAMUSCULAR | Status: DC | PRN
  Administered 2017-09-14: 8 mg via INTRAVENOUS

## 2017-09-14 MED ORDER — TORSEMIDE 20 MG PO TABS
20.0000 mg | ORAL_TABLET | Freq: Every day | ORAL | Status: DC | PRN
  Filled 2017-09-14: qty 1

## 2017-09-14 MED ORDER — LIDOCAINE 2% (20 MG/ML) 5 ML SYRINGE
INTRAMUSCULAR | Status: DC | PRN
  Administered 2017-09-14: 100 mg via INTRAVENOUS

## 2017-09-14 MED ORDER — PHENYLEPHRINE 40 MCG/ML (10ML) SYRINGE FOR IV PUSH (FOR BLOOD PRESSURE SUPPORT)
PREFILLED_SYRINGE | INTRAVENOUS | Status: AC
  Filled 2017-09-14: qty 10

## 2017-09-14 MED ORDER — BACITRACIN ZINC 500 UNIT/GM EX OINT
TOPICAL_OINTMENT | CUTANEOUS | Status: DC | PRN
  Administered 2017-09-14: 1 via TOPICAL

## 2017-09-14 MED ORDER — ONDANSETRON HCL 4 MG/2ML IJ SOLN
INTRAMUSCULAR | Status: DC | PRN
  Administered 2017-09-14: 4 mg via INTRAVENOUS

## 2017-09-14 MED ORDER — PROMETHAZINE HCL 25 MG/ML IJ SOLN: 6.2500 mg | INTRAMUSCULAR | Status: DC | PRN

## 2017-09-14 MED ORDER — CHLORHEXIDINE GLUCONATE CLOTH 2 % EX PADS: 6.0000 | MEDICATED_PAD | Freq: Once | CUTANEOUS | Status: DC

## 2017-09-14 MED ORDER — BUPIVACAINE-EPINEPHRINE (PF) 0.5% -1:200000 IJ SOLN
INTRAMUSCULAR | Status: DC | PRN
  Administered 2017-09-14: 10 mL via PERINEURAL

## 2017-09-14 MED ORDER — PHENOL 1.4 % MT LIQD: 1.0000 | OROMUCOSAL | Status: DC | PRN

## 2017-09-14 MED ORDER — SODIUM CHLORIDE 0.9% FLUSH: 3.0000 mL | Freq: Two times a day (BID) | INTRAVENOUS | Status: DC

## 2017-09-14 MED ORDER — ARTIFICIAL TEARS OPHTHALMIC OINT
TOPICAL_OINTMENT | OPHTHALMIC | Status: AC
  Filled 2017-09-14: qty 3.5

## 2017-09-14 MED ORDER — ROCURONIUM BROMIDE 10 MG/ML (PF) SYRINGE
PREFILLED_SYRINGE | INTRAVENOUS | Status: DC | PRN
  Administered 2017-09-14 (×3): 20 mg via INTRAVENOUS
  Administered 2017-09-14: 50 mg via INTRAVENOUS

## 2017-09-14 MED ORDER — ONDANSETRON HCL 4 MG/2ML IJ SOLN: 4.0000 mg | Freq: Four times a day (QID) | INTRAMUSCULAR | Status: DC | PRN

## 2017-09-14 MED ORDER — MIDAZOLAM HCL 2 MG/2ML IJ SOLN
INTRAMUSCULAR | Status: AC
  Filled 2017-09-14: qty 2

## 2017-09-14 MED ORDER — ONDANSETRON HCL 4 MG/2ML IJ SOLN
INTRAMUSCULAR | Status: AC
  Filled 2017-09-14: qty 2

## 2017-09-14 MED ORDER — VANCOMYCIN HCL 1 G IV SOLR
INTRAVENOUS | Status: DC | PRN
  Administered 2017-09-14: 1000 mg via TOPICAL

## 2017-09-14 MED ORDER — SODIUM CHLORIDE 0.9 % IV SOLN: 250.0000 mL | INTRAVENOUS | Status: DC

## 2017-09-14 MED ORDER — VANCOMYCIN HCL IN DEXTROSE 1-5 GM/200ML-% IV SOLN
1000.0000 mg | Freq: Once | INTRAVENOUS | Status: AC
  Administered 2017-09-14: 1000 mg via INTRAVENOUS
  Filled 2017-09-14: qty 200

## 2017-09-14 MED ORDER — THROMBIN 5000 UNITS EX SOLR
CUTANEOUS | Status: DC | PRN
  Administered 2017-09-14: 12:00:00 via TOPICAL

## 2017-09-14 MED ORDER — MORPHINE SULFATE (PF) 4 MG/ML IV SOLN: 4.0000 mg | INTRAVENOUS | Status: DC | PRN

## 2017-09-14 MED ORDER — MIDAZOLAM HCL 2 MG/2ML IJ SOLN
INTRAMUSCULAR | Status: DC | PRN
  Administered 2017-09-14 (×2): 1 mg via INTRAVENOUS

## 2017-09-14 MED ORDER — 0.9 % SODIUM CHLORIDE (POUR BTL) OPTIME
TOPICAL | Status: DC | PRN
  Administered 2017-09-14: 1000 mL

## 2017-09-14 MED ORDER — LISINOPRIL 10 MG PO TABS
10.0000 mg | ORAL_TABLET | Freq: Every day | ORAL | Status: DC
  Administered 2017-09-14: 10 mg via ORAL
  Filled 2017-09-14: qty 1

## 2017-09-14 SURGICAL SUPPLY — 64 items
BAG DECANTER FOR FLEXI CONT (MISCELLANEOUS) ×3 IMPLANT
BENZOIN TINCTURE PRP APPL 2/3 (GAUZE/BANDAGES/DRESSINGS) ×3 IMPLANT
BLADE CLIPPER SURG (BLADE) IMPLANT
BUR MATCHSTICK NEURO 3.0 LAGG (BURR) ×3 IMPLANT
BUR PRECISION FLUTE 6.0 (BURR) ×3 IMPLANT
CANISTER SUCT 3000ML PPV (MISCELLANEOUS) ×3 IMPLANT
CAP REVERE LOCKING (Cap) ×12 IMPLANT
CARTRIDGE OIL MAESTRO DRILL (MISCELLANEOUS) ×1 IMPLANT
CLOSURE WOUND 1/2 X4 (GAUZE/BANDAGES/DRESSINGS) ×1
CONT SPEC 4OZ CLIKSEAL STRL BL (MISCELLANEOUS) ×3 IMPLANT
COVER BACK TABLE 60X90IN (DRAPES) ×3 IMPLANT
DECANTER SPIKE VIAL GLASS SM (MISCELLANEOUS) ×3 IMPLANT
DIFFUSER DRILL AIR PNEUMATIC (MISCELLANEOUS) ×3 IMPLANT
DRAPE C-ARM 42X72 X-RAY (DRAPES) ×6 IMPLANT
DRAPE HALF SHEET 40X57 (DRAPES) ×3 IMPLANT
DRAPE LAPAROTOMY 100X72X124 (DRAPES) ×3 IMPLANT
DRAPE SURG 17X23 STRL (DRAPES) ×12 IMPLANT
DRSG OPSITE POSTOP 4X8 (GAUZE/BANDAGES/DRESSINGS) ×3 IMPLANT
ELECT BLADE 4.0 EZ CLEAN MEGAD (MISCELLANEOUS) ×3
ELECT REM PT RETURN 9FT ADLT (ELECTROSURGICAL) ×3
ELECTRODE BLDE 4.0 EZ CLN MEGD (MISCELLANEOUS) ×1 IMPLANT
ELECTRODE REM PT RTRN 9FT ADLT (ELECTROSURGICAL) ×1 IMPLANT
EVACUATOR 1/8 PVC DRAIN (DRAIN) IMPLANT
GAUZE SPONGE 4X4 12PLY STRL (GAUZE/BANDAGES/DRESSINGS) ×3 IMPLANT
GAUZE SPONGE 4X4 16PLY XRAY LF (GAUZE/BANDAGES/DRESSINGS) ×3 IMPLANT
GLOVE BIO SURGEON STRL SZ8 (GLOVE) ×6 IMPLANT
GLOVE BIO SURGEON STRL SZ8.5 (GLOVE) ×6 IMPLANT
GLOVE ECLIPSE 6.5 STRL STRAW (GLOVE) ×6 IMPLANT
GLOVE ECLIPSE 7.0 STRL STRAW (GLOVE) ×6 IMPLANT
GLOVE EXAM NITRILE LRG STRL (GLOVE) IMPLANT
GLOVE EXAM NITRILE XL STR (GLOVE) IMPLANT
GLOVE EXAM NITRILE XS STR PU (GLOVE) IMPLANT
GOWN STRL REUS W/ TWL LRG LVL3 (GOWN DISPOSABLE) IMPLANT
GOWN STRL REUS W/ TWL XL LVL3 (GOWN DISPOSABLE) ×2 IMPLANT
GOWN STRL REUS W/TWL 2XL LVL3 (GOWN DISPOSABLE) IMPLANT
GOWN STRL REUS W/TWL LRG LVL3 (GOWN DISPOSABLE)
GOWN STRL REUS W/TWL XL LVL3 (GOWN DISPOSABLE) ×4
HEMOSTAT POWDER KIT SURGIFOAM (HEMOSTASIS) ×3 IMPLANT
KIT BASIN OR (CUSTOM PROCEDURE TRAY) ×3 IMPLANT
KIT TURNOVER KIT B (KITS) ×3 IMPLANT
MILL MEDIUM DISP (BLADE) IMPLANT
NEEDLE HYPO 21X1.5 SAFETY (NEEDLE) IMPLANT
NEEDLE HYPO 22GX1.5 SAFETY (NEEDLE) ×3 IMPLANT
NS IRRIG 1000ML POUR BTL (IV SOLUTION) ×3 IMPLANT
OIL CARTRIDGE MAESTRO DRILL (MISCELLANEOUS) ×3
PACK LAMINECTOMY NEURO (CUSTOM PROCEDURE TRAY) ×3 IMPLANT
PAD ARMBOARD 7.5X6 YLW CONV (MISCELLANEOUS) ×9 IMPLANT
PATTIES SURGICAL .5 X1 (DISPOSABLE) IMPLANT
ROD CURVED REVERE 6.35X50MM (Rod) ×6 IMPLANT
SCREW 7.5X50MM (Screw) ×12 IMPLANT
SPACER ALTERA 10X31-15 (Spacer) ×3 IMPLANT
SPONGE LAP 4X18 RFD (DISPOSABLE) IMPLANT
SPONGE NEURO XRAY DETECT 1X3 (DISPOSABLE) IMPLANT
SPONGE SURGIFOAM ABS GEL 100 (HEMOSTASIS) IMPLANT
STRIP BIOACTIVE 20CC 25X100X8 (Miscellaneous) ×3 IMPLANT
STRIP CLOSURE SKIN 1/2X4 (GAUZE/BANDAGES/DRESSINGS) ×2 IMPLANT
SUT VIC AB 1 CT1 18XBRD ANBCTR (SUTURE) ×2 IMPLANT
SUT VIC AB 1 CT1 8-18 (SUTURE) ×4
SUT VIC AB 2-0 CP2 18 (SUTURE) ×6 IMPLANT
SYR 20CC LL (SYRINGE) IMPLANT
TOWEL GREEN STERILE (TOWEL DISPOSABLE) ×3 IMPLANT
TOWEL GREEN STERILE FF (TOWEL DISPOSABLE) ×3 IMPLANT
TRAY FOLEY MTR SLVR 16FR STAT (SET/KITS/TRAYS/PACK) ×3 IMPLANT
WATER STERILE IRR 1000ML POUR (IV SOLUTION) ×3 IMPLANT

## 2017-09-14 NOTE — Op Note (Signed)
Brief history: The patient is a 72 year old white female who is had lumbar surgery many years ago by another physician.  She has developed recurrent back and leg pain consistent with neurogenic claudication.  She has failed medical management.  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  I discussed his various treatment options with the patient, including surgery.  She has weighed the risk, benefits, and alternatives surgery and decided to proceed with a lumbar decompression and fusion.  Preoperative diagnosis: L3-4 spondylolisthesis, degenerative disc disease, L2-3 and L3-4 spinal stenosis compressing the L 2, L3, L4 nerve roots; lumbago; lumbar radiculopathy; neurogenic claudication  Postoperative diagnosis: The same  Procedure: L3-4 laminectomy, L2-3 bilateral laminotomy/foraminotomies to decompress the bilateral L2, L3 and L4 nerve roots(the work required to do this was in addition to the work required to do the posterior lumbar interbody fusion because of the patient's spinal stenosis, facet arthropathy. Etc. requiring a wide decompression of the nerve roots.);  L3-4 transforaminal lumbar interbody fusion with local morselized autograft bone and Kinnex graft extender; insertion of interbody prosthesis at L3-4 (globus peek expandable interbody prosthesis); posterior nonsegmental instrumentation from L3 to L4 with globus titanium pedicle screws and rods; posterior lateral arthrodesis at 3 4 bilaterally with local morselized autograft bone and Kinnex bone graft extender.  Surgeon: Dr. Earle Gell  Asst.: Dr. Christella Noa  Anesthesia: Gen. endotracheal  Estimated blood loss: 150 cc  Drains: None  Complications: None  Description of procedure: The patient was brought to the operating room by the anesthesia team. General endotracheal anesthesia was induced. The patient was  turned to the prone position on the Wilson frame. The patient's lumbosacral region was then prepared with Betadine scrub and Betadine solution. Sterile drapes were applied.  I then injected the area to be incised with Marcaine with epinephrine solution. I then used the scalpel to make a linear midline incision over the L2-3 and L3-4 interspace incising through the old surgical scar. I then used electrocautery to perform a bilateral subperiosteal dissection exposing the spinous process and lamina of L2, L3 and L4. We then obtained intraoperative radiograph to confirm our location. We then inserted the Verstrac retractor to provide exposure.  I began the decompression by using the high speed drill to perform laminotomies at L2-3 and L3-4 bilaterally.  We used the Leksell rongeur to remove the L3 spinous process and lamina.  We then used the Kerrison punches to complete the L3-4 laminectomy and to widen the bilateral laminotomies at L2-3 and removed the ligamentum flavum at L2-3 and L3-4. We used the Kerrison punches to remove the medial facets at L2-3 and L3-4. We performed wide foraminotomies about the bilateral L2, L3 and L4 nerve roots completing the decompression.  We now turned our attention to the posterior lumbar interbody fusion. I used a scalpel to incise the intervertebral disc at L3-4 bilaterally. I then performed a partial intervertebral discectomy at L3-4 bilaterally using the pituitary forceps. We prepared the vertebral endplates at C6-2 bilaterally for the fusion by removing the soft tissues with the curettes. We then used the trial spacers to pick the appropriate sized interbody prosthesis. We prefilled his prosthesis with a combination of local morselized autograft bone that we obtained during the decompression as well as Kinnex bone graft extender. We inserted the prefilled prosthesis into the interspace at L3-4 from the right, we then expanded the prosthesis. There was a  good snug fit of the  prosthesis in the interspace. We then filled and the remainder of the intervertebral disc space with local morselized autograft bone and Kinnex. This completed the posterior lumbar interbody arthrodesis.  We now turned attention to the instrumentation. Under fluoroscopic guidance we cannulated the bilateral L3 and L4 pedicles with the bone probe. We then removed the bone probe. We then tapped the pedicle with a 6.5 millimeter tap. We then removed the tap. We probed inside the tapped pedicle with a ball probe to rule out cortical breaches. We then inserted a 7.5 x 55 millimeter pedicle screw into the L3 and L4 pedicles bilaterally under fluoroscopic guidance. We then palpated along the medial aspect of the pedicles to rule out cortical breaches. There were none. The nerve roots were not injured. We then connected the unilateral pedicle screws with a lordotic rod. We compressed the construct and secured the rod in place with the caps. We then tightened the caps appropriately. This completed the instrumentation from L3-4 bilaterally.  We now turned our attention to the posterior lateral arthrodesis at L3-4 bilaterally. We used the high-speed drill to decorticate the remainder of the facets, pars, transverse process at L3-4 bilaterally. We then applied a combination of local morselized autograft bone and Kinnex bone graft extender over these decorticated posterior lateral structures. This completed the posterior lateral arthrodesis.  We then obtained hemostasis using bipolar electrocautery. We irrigated the wound out with bacitracin solution. We inspected the thecal sac and nerve roots and noted they were well decompressed. We then removed the retractor. We placed vancomycin powder in the wound. We reapproximated patient's thoracolumbar fascia with interrupted #1 Vicryl suture. We reapproximated patient's subcutaneous tissue with interrupted 2-0 Vicryl suture. The reapproximated patient's skin with Steri-Strips  and benzoin. The wound was then coated with bacitracin ointment. A sterile dressing was applied. The drapes were removed. The patient was subsequently returned to the supine position where they were extubated by the anesthesia team. He was then transported to the post anesthesia care unit in stable condition. All sponge instrument and needle counts were reportedly correct at the end of this case.

## 2017-09-14 NOTE — Transfer of Care (Signed)
Immediate Anesthesia Transfer of Care Note  Patient: Michele Mcgee  Procedure(s) Performed: POSTERIOR LUMBAR INTERBODY FUSION, INTERBODY PROSTHESIS, POSTERIOR INSTRUMENTATION AND FUSION LUMBAR THREE- LUMBAR FOUR, LUMBAR TWO- LUMBAR THREE LAMINOTOMIES (N/A )  Patient Location: PACU  Anesthesia Type:General  Level of Consciousness: awake, alert , oriented and patient cooperative  Airway & Oxygen Therapy: Patient Spontanous Breathing and Patient connected to face mask oxygen  Post-op Assessment: Report given to RN and Post -op Vital signs reviewed and stable  Post vital signs: Reviewed and stable  Last Vitals:  Vitals Value Taken Time  BP 155/81 09/14/2017  3:40 PM  Temp    Pulse 91 09/14/2017  3:43 PM  Resp 21 09/14/2017  3:43 PM  SpO2 97 % 09/14/2017  3:43 PM  Vitals shown include unvalidated device data.  Last Pain:  Vitals:   09/14/17 0845  TempSrc: Oral  PainSc:          Complications: No apparent anesthesia complications

## 2017-09-14 NOTE — Progress Notes (Signed)
Subjective: The patient is alert and pleasant.  She is in no apparent distress.  She looks well.  Objective: Vital signs in last 24 hours: Temp:  [98.7 F (37.1 C)] 98.7 F (37.1 C) (06/05 0845) Pulse Rate:  [71] 71 (06/05 0845) Resp:  [20] 20 (06/05 0845) BP: (161)/(70) 161/70 (06/05 0845) SpO2:  [95 %] 95 % (06/05 0845) Weight:  [113.2 kg (249 lb 9.6 oz)] 113.2 kg (249 lb 9.6 oz) (06/05 0845) Estimated body mass index is 54.01 kg/m as calculated from the following:   Height as of this encounter: 4\' 9"  (1.448 m).   Weight as of this encounter: 113.2 kg (249 lb 9.6 oz).   Intake/Output from previous day: No intake/output data recorded. Intake/Output this shift: Total I/O In: 1900 [I.V.:1400; IV Piggyback:500] Out: 285 [Urine:135; Blood:150]  Physical exam the patient is alert and pleasant.  She is moving her lower extremities well.  Lab Results: No results for input(s): WBC, HGB, HCT, PLT in the last 72 hours. BMET No results for input(s): NA, K, CL, CO2, GLUCOSE, BUN, CREATININE, CALCIUM in the last 72 hours.  Studies/Results: No results found.  Assessment/Plan: The patient is doing well.  I spoke with her family.  LOS: 0 days     Ophelia Charter 09/14/2017, 3:45 PM

## 2017-09-14 NOTE — H&P (Signed)
Subjective: The patient is a 72 year old white female who has complained of back and leg pain.  She has failed medical management.  She was worked up with lumbar x-rays and lumbar MRI.  This demonstrates spondylosis stenosis and spondylolisthesis.  I discussed the various treatment options with her.  She has decided proceed with surgery.  Past Medical History:  Diagnosis Date  . Anxiety   . Arthritis   . Choledocholithiasis   . Coronary artery disease   . Female bladder prolapse   . GERD (gastroesophageal reflux disease)   . Hypercholesterolemia   . Hypertension   . PONV (postoperative nausea and vomiting)   . Spondylolisthesis of lumbar region     Past Surgical History:  Procedure Laterality Date  . ABDOMINAL HYSTERECTOMY    . BACK SURGERY    . Bladder Tacking Surgery    . COLONOSCOPY    . COLONOSCOPY WITH PROPOFOL N/A 12/09/2014   Procedure: COLONOSCOPY WITH PROPOFOL;  Surgeon: Manya Silvas, MD;  Location: [facility] ENDOSCOPY;  Service: Endoscopy;  Laterality: N/A;  . ERCP    . ESOPHAGEAL DILATION    . ESOPHAGOGASTRODUODENOSCOPY    . LAMINECTOMY    . TUBAL LIGATION    . VEIN LIGATION AND STRIPPING      Allergies  Allergen Reactions  . Ampicillin Rash    Has patient had a PCN reaction causing immediate rash, facial/tongue/throat swelling, SOB or lightheadedness with hypotension: No Has patient had a PCN reaction causing severe rash involving mucus membranes or skin necrosis: No Has patient had a PCN reaction that required hospitalization: No Has patient had a PCN reaction occurring within the last 10 years: No If all of the above answers are "NO", then may proceed with Cephalosporin use.   Marland Kitchen Fosamax [Alendronate] Other (See Comments)    Pt does not recall what reaction was  . Sulfa Antibiotics     "I don't know anything about this"  . Sulfasalazine Other (See Comments)    unknown    Social History   Tobacco Use  . Smoking status: Never Smoker  . Smokeless tobacco:  Never Used  Substance Use Topics  . Alcohol use: Never    Frequency: Never    Family History  Problem Relation Age of Onset  . Breast cancer Neg Hx    Prior to Admission medications   Medication Sig Start Date End Date Taking? Authorizing Provider  aspirin (ST JOSEPH ASPIRIN) 81 MG EC tablet Take 81 mg by mouth daily. Swallow whole.   Yes [provider]  atorvastatin (LIPITOR) 40 MG tablet Take 40 mg by mouth every evening.   Yes [provider]  Calcium Carb-Cholecalciferol (CALCIUM 600+D3 PO) Take 1 tablet by mouth 2 (two) times daily.   Yes [provider]  docusate sodium (COLACE) 100 MG capsule Take 100 mg by mouth daily.   Yes [provider]  escitalopram (LEXAPRO) 20 MG tablet Take 20 mg by mouth daily.   Yes [provider]  esomeprazole (NEXIUM) 40 MG capsule Take 40 mg by mouth daily before breakfast. 30 minutes to 1 hour prior to breakfast   Yes [provider]  fluticasone (FLONASE) 50 MCG/ACT nasal spray Place 1 spray into both nostrils daily as needed for allergies.   Yes [provider]  lisinopril-hydrochlorothiazide (PRINZIDE,ZESTORETIC) 10-12.5 MG per tablet Take 1 tablet by mouth daily.   Yes [provider]  metoprolol succinate (TOPROL-XL) 25 MG 24 hr tablet Take 25 mg by mouth daily.  Yes [provider]  Multiple Vitamin (MULTIVITAMIN WITH MINERALS) TABS tablet Take 1 tablet by mouth daily. Centrum Silver for Women 50+   Yes [provider]  torsemide (DEMADEX) 20 MG tablet Take 20 mg by mouth daily as needed (for swelling in feet).   Yes [provider]     Review of Systems  Positive ROS: As above  All other systems have been reviewed and were otherwise negative with the exception of those mentioned in the HPI and as above.  Objective: Vital signs in last 24 hours: Temp:  [98.7 F (37.1 C)] 98.7 F (37.1 C) (06/05 0845) Pulse Rate:  [71] 71 (06/05  0845) Resp:  [20] 20 (06/05 0845) BP: (161)/(70) 161/70 (06/05 0845) SpO2:  [95 %] 95 % (06/05 0845) Weight:  [113.2 kg (249 lb 9.6 oz)] 113.2 kg (249 lb 9.6 oz) (06/05 0845) Estimated body mass index is 54.01 kg/m as calculated from the following:   Height as of this encounter: 4\' 9"  (1.448 m).   Weight as of this encounter: 113.2 kg (249 lb 9.6 oz).   General Appearance: Alert Head: Normocephalic, without obvious abnormality, atraumatic Eyes: PERRL, conjunctiva/corneas clear, EOM's intact,    Ears: Normal  Throat: Normal  Neck: Supple, Back: unremarkable Lungs: Clear to auscultation bilaterally, respirations unlabored Heart: Regular rate and rhythm, no murmur, rub or gallop Abdomen: Soft, non-tender Extremities: Extremities normal, atraumatic, no cyanosis or edema Skin: unremarkable  NEUROLOGIC:   Mental status: alert and oriented,Motor Exam - grossly normal Sensory Exam - grossly normal Reflexes:  Coordination - grossly normal Gait - grossly normal Balance - grossly normal Cranial Nerves: I: smell Not tested  II: visual acuity  OS: Normal  OD: Normal   II: visual fields Full to confrontation  II: pupils Equal, round, reactive to light  III,VII: ptosis None  III,IV,VI: extraocular muscles  Full ROM  V: mastication Normal  V: facial light touch sensation  Normal  V,VII: corneal reflex  Present  VII: facial muscle function - upper  Normal  VII: facial muscle function - lower Normal  VIII: hearing Not tested  IX: soft palate elevation  Normal  IX,X: gag reflex Present  XI: trapezius strength  5/5  XI: sternocleidomastoid strength 5/5  XI: neck flexion strength  5/5  XII: tongue strength  Normal    Data Review Lab Results  Component Value Date   WBC 5.5 09/09/2017   HGB 13.9 09/09/2017   HCT 41.4 09/09/2017   MCV 91.6 09/09/2017   PLT 151 09/09/2017   Lab Results  Component Value Date   NA 141 09/09/2017   K 4.0 09/09/2017   CL 104 09/09/2017   CO2 31  09/09/2017   BUN 15 09/09/2017   CREATININE 0.82 09/09/2017   GLUCOSE 139 (H) 09/09/2017   No results found for: INR, PROTIME  Assessment/Plan: L3-4 spondylolisthesis, L2-3 and L3-4 spinal stenosis, lumbago, lumbar radiculopathy, neurogenic claudication: I have discussed the situation with the patient and her daughter.  I have reviewed her imaging studies with him.  We have discussed the various treatment options including surgery.  I have described the surgical treatment option of an L 2- 3 and L3-4 laminectomy with instrumentation and fusion at L3-4.  I have shown him surgical models.  I have given them a surgical pamphlet.  We have discussed the risks, benefits, alternatives, expected postoperative course, and likelihood of achieving our goals with surgery.  I have answered all their questions.  She has decided to proceed  with surgery.   Ophelia Charter 09/14/2017 10:48 AM

## 2017-09-14 NOTE — Anesthesia Procedure Notes (Signed)
Procedure Name: Intubation Date/Time: 09/14/2017 11:15 AM Performed by: Julieta Bellini, CRNA Pre-anesthesia Checklist: Patient identified, Emergency Drugs available, Suction available and Patient being monitored Patient Re-evaluated:Patient Re-evaluated prior to induction Oxygen Delivery Method: Circle system utilized Preoxygenation: Pre-oxygenation with 100% oxygen Induction Type: IV induction Ventilation: Mask ventilation with difficulty, Oral airway inserted - appropriate to patient size and Two handed mask ventilation required Laryngoscope Size: Glidescope and 3 Grade View: Grade I Tube type: Oral Tube size: 7.0 mm Number of attempts: 1 Airway Equipment and Method: Stylet and Video-laryngoscopy Placement Confirmation: ETT inserted through vocal cords under direct vision,  positive ETCO2 and breath sounds checked- equal and bilateral Secured at: 20 cm Tube secured with: Tape Dental Injury: Teeth and Oropharynx as per pre-operative assessment  Difficulty Due To: Difficulty was anticipated

## 2017-09-14 NOTE — Anesthesia Preprocedure Evaluation (Addendum)
Anesthesia Evaluation  Patient identified by MRN, date of birth, ID band Patient awake    Reviewed: Allergy & Precautions, H&P , NPO status , Patient's Chart, lab work & pertinent test results, reviewed documented beta blocker date and time   History of Anesthesia Complications (+) PONV and history of anesthetic complications  Airway Mallampati: III  TM Distance: >3 FB Neck ROM: full    Dental  (+) Edentulous Upper, Poor Dentition, Missing, Partial Lower   Pulmonary neg pulmonary ROS,    Pulmonary exam normal breath sounds clear to auscultation       Cardiovascular Exercise Tolerance: Good hypertension, Pt. on medications and Pt. on home beta blockers + CAD   Rhythm:regular Rate:Normal     Neuro/Psych Anxiety negative neurological ROS  negative psych ROS   GI/Hepatic Neg liver ROS, GERD  Medicated and Controlled,  Endo/Other  negative endocrine ROS  Renal/GU negative Renal ROS     Musculoskeletal  (+) Arthritis ,   Abdominal   Peds  Hematology negative hematology ROS (+)   Anesthesia Other Findings Obesity.  Reproductive/Obstetrics negative OB ROS                             Anesthesia Physical  Anesthesia Plan  ASA: IV  Anesthesia Plan: General   Post-op Pain Management:    Induction:   PONV Risk Score and Plan: 4 or greater and Dexamethasone, Ondansetron, Treatment may vary due to age or medical condition and Propofol infusion  Airway Management Planned: Video Laryngoscope Planned and Oral ETT  Additional Equipment: Arterial line  Intra-op Plan:   Post-operative Plan: Extubation in OR  Informed Consent: I have reviewed the patients History and Physical, chart, labs and discussed the procedure including the risks, benefits and alternatives for the proposed anesthesia with the patient or authorized representative who has indicated his/her understanding and acceptance.    Dental Advisory Given  Plan Discussed with: CRNA  Anesthesia Plan Comments: (Sufentanil and low dose propofol infusions)       Anesthesia Quick Evaluation

## 2017-09-15 LAB — CBC
HCT: 34.3 % — ABNORMAL LOW (ref 36.0–46.0)
Hemoglobin: 11.2 g/dL — ABNORMAL LOW (ref 12.0–15.0)
MCH: 30.7 pg (ref 26.0–34.0)
MCHC: 32.7 g/dL (ref 30.0–36.0)
MCV: 94 fL (ref 78.0–100.0)
PLATELETS: 142 10*3/uL — AB (ref 150–400)
RBC: 3.65 MIL/uL — ABNORMAL LOW (ref 3.87–5.11)
RDW: 13.3 % (ref 11.5–15.5)
WBC: 11.6 10*3/uL — ABNORMAL HIGH (ref 4.0–10.5)

## 2017-09-15 LAB — BASIC METABOLIC PANEL
Anion gap: 8 (ref 5–15)
BUN: 18 mg/dL (ref 6–20)
CALCIUM: 8.3 mg/dL — AB (ref 8.9–10.3)
CO2: 29 mmol/L (ref 22–32)
CREATININE: 0.93 mg/dL (ref 0.44–1.00)
Chloride: 101 mmol/L (ref 101–111)
GFR calc non Af Amer: 60 mL/min — ABNORMAL LOW (ref >60)
Glucose, Bld: 146 mg/dL — ABNORMAL HIGH (ref 65–99)
Potassium: 4.1 mmol/L (ref 3.5–5.1)
Sodium: 138 mmol/L (ref 135–145)

## 2017-09-15 MED ORDER — CYCLOBENZAPRINE HCL 10 MG PO TABS: 10.0000 mg | ORAL_TABLET | Freq: Three times a day (TID) | ORAL | 1 refills | Status: DC | PRN

## 2017-09-15 MED ORDER — OXYCODONE HCL 5 MG PO TABS: 5.0000 mg | ORAL_TABLET | ORAL | 0 refills | Status: DC | PRN

## 2017-09-15 NOTE — Progress Notes (Signed)
OT Note - Addendum    09/15/17 1135  OT Visit Information  Last OT Received On 09/15/17  OT Time Calculation  OT Start Time (ACUTE ONLY) 1044  OT Stop Time (ACUTE ONLY) 1059  OT Time Calculation (min) 15 min  OT General Charges  $OT Visit 1 Visit  OT Treatments  $Self Care/Home Management  8-22 mins  Maurie Boettcher, OT/L  OT Clinical Specialist [phone]

## 2017-09-15 NOTE — Progress Notes (Signed)
Occupational Therapy Evaluation Patient Details Name: Michele Mcgee MRN: [id number] DOB: 1945/11/03 Today's Date: 09/15/2017    History of Present Illness Pt is a 72 y/o female who presents s/p L2-L3 PLIF on 09/14/17. PMH significant for HTN, CAD.    Clinical Impression   PTA, pt was living alone, and was modified independent with ADLs with intermittent use of SPC. Pt currently requires minimal assistance with functional mobility for safety and adherence to back precautions and moderate assistance for AE for LB ADLs. Pt appears anxious at times during mobility. Will follow up to educate pt's family in selfcare with use of DME and AE to facilitate safe d/c home. Due to decline in current level of function, feel patient will benefit from skilled HHOT to increase their independence and safety with functional mobility and ADLs (while adhering to their precautions) to help her reach her goal to return to living independently.      Follow Up Recommendations  Home health OT;Supervision/Assistance - 24 hour(pt preference for daughter to assist with ADL)    Equipment Recommendations  None recommended by OT    Recommendations for Other Services PT consult     Precautions / Restrictions Precautions Precautions: Fall;Back Precaution Booklet Issued: Yes (comment) Precaution Comments: VC to adhere to precautions Required Braces or Orthoses: Spinal Brace Spinal Brace: Lumbar corset;Applied in sitting position Restrictions Weight Bearing Restrictions: No      Mobility Bed Mobility               General bed mobility comments: pt sitting in recliner with brace upon arrival   Transfers Overall transfer level: Needs assistance Equipment used: Rolling walker (2 wheeled) Transfers: Sit to/from Stand Sit to Stand: Min assist         General transfer comment: MinA for powerup;held her breath during sit<>stand, educated on pursed lip breathing;VC for safe hand placement    Balance  Overall balance assessment: Needs assistance Sitting-balance support: Feet supported;No upper extremity supported Sitting balance-Leahy Scale: Fair     Standing balance support: Bilateral upper extremity supported Standing balance-Leahy Scale: Poor Standing balance comment: heavy reliance on RW for UE support                           ADL either performed or assessed with clinical judgement   ADL Overall ADL's : Needs assistance/impaired Eating/Feeding: Set up   Grooming: Set up   Upper Body Bathing: Minimal assistance(minA to don brace)   Lower Body Bathing: Supervison/ safety;With adaptive equipment;Sitting/lateral leans   Upper Body Dressing : Supervision/safety;Sitting   Lower Body Dressing: Moderate assistance;Sitting/lateral leans Lower Body Dressing Details (indicate cue type and reason): step by step instruction to don sock with sock aid; able to doff sock with reacher Toilet Transfer: Minimal assistance;Cueing for safety;Grab bars;RW;Ambulation   Toileting- Clothing Manipulation and Hygiene: Minimal assistance(minA for stability, pt able to doff briefs) Toileting - Clothing Manipulation Details (indicate cue type and reason): introduced and began education on use of toilet tongs     Functional mobility during ADLs: Minimal assistance;Rolling walker General ADL Comments: Pt expressed need for assistance to adjust brace in standing due to fatigue;Pt would benefit from AE;Pt required VC for safe hand placement and adherence to back precautions; required step-by-step instruction with return demonstration of AE;     Vision Baseline Vision/History: Wears glasses Wears Glasses: At all times       Perception     Praxis  Pertinent Vitals/Pain Pain Assessment: Faces Pain Score: 5  Faces Pain Scale: Hurts little more Pain Location: incision site Pain Descriptors / Indicators: Operative site guarding;Sore Pain Intervention(s): Limited activity within  patient's tolerance;Monitored during session     Hand Dominance Right   Extremity/Trunk Assessment Upper Extremity Assessment Upper Extremity Assessment: Overall WFL for tasks assessed   Lower Extremity Assessment Lower Extremity Assessment: Generalized weakness LLE Deficits / Details: Decreased strength and AROM consistent with pre-op diagnosis.    Cervical / Trunk Assessment Cervical / Trunk Assessment: Other exceptions Cervical / Trunk Exceptions: back precautions   Communication Communication Communication: No difficulties   Cognition Arousal/Alertness: Awake/alert Behavior During Therapy: WFL for tasks assessed/performed Overall Cognitive Status: No family/caregiver present to determine baseline cognitive functioning                                 General Comments: slow processing;requiring multiple repetitions of instructions   General Comments  pt reports furniture walking at home when not using RW;pt educated on use of AE to assist with ADL, will return at later time to educate family on use of AE; pt to purchase AE at later date    Exercises     Shoulder Instructions      Home Living Family/patient expects to be discharged to:: Private residence Living Arrangements: Alone Available Help at Discharge: Family;Available 24 hours/day Type of Home: House Home Access: Stairs to enter [employer] of Steps: 2 Entrance Stairs-Rails: Right;Left;Can reach both Home Layout: One level     Bathroom Shower/Tub: Teacher, early years/pre: Standard Bathroom Accessibility: Yes How Accessible: Accessible via walker Home Equipment: [place] - 2 wheels;Cane - single point;Shower seat;Grab bars - tub/shower          Prior Functioning/Environment Level of Independence: Independent with assistive device(s)        Comments: Pt reports using cane for support prior to surgery.         OT Problem List: Decreased strength;Decreased range of  motion;Decreased activity tolerance;Impaired balance (sitting and/or standing);Decreased safety awareness;Decreased knowledge of use of DME or AE;Decreased knowledge of precautions;Obesity;Pain      OT Treatment/Interventions: Self-care/ADL training;Therapeutic exercise;Energy conservation;DME and/or AE instruction;Therapeutic activities;Patient/family education;Balance training    OT Goals(Current goals can be found in the care plan section) Acute Rehab OT Goals Patient Stated Goal: to go home OT Goal Formulation: With patient Time For Goal Achievement: 09/29/17 Potential to Achieve Goals: Good ADL Goals Pt Will Perform Lower Body Dressing: with modified independence Pt Will Transfer to Toilet: with modified independence;ambulating;grab bars Additional ADL Goal #1: Pt/caregiver will return demonstrate use of AE independently.  OT Frequency: Min 2X/week   Barriers to D/C:            Co-evaluation              AM-PAC PT "6 Clicks" Daily Activity     Outcome Measure Help from another person eating meals?: None Help from another person taking care of personal grooming?: A Little Help from another person toileting, which includes using toliet, bedpan, or urinal?: A Little Help from another person bathing (including washing, rinsing, drying)?: A Little Help from another person to put on and taking off regular upper body clothing?: None Help from another person to put on and taking off regular lower body clothing?: A Little 6 Click Score: 20   End of Session Equipment Utilized During Treatment: Gait belt;Rolling walker;Back brace  Nurse Communication: Other (comment)(pt left on toilet;pt to call when finished)  Activity Tolerance: Patient limited by fatigue Patient left: Other (comment)(on toilet;with call bell within reach)  OT Visit Diagnosis: Unsteadiness on feet (R26.81);Other abnormalities of gait and mobility (R26.89)                Time: 1771-1657 OT Time Calculation  (min): 15 min Charges:  OT General Charges $OT Visit: 1 Visit OT Evaluation $OT Eval Moderate Complexity: 1 Mod OT Treatments $Self Care/Home Management : 8-22 mins G-Codes:     [name] OTS   09/15/2017, 1:22 PM

## 2017-09-15 NOTE — Progress Notes (Signed)
Physical Therapy Evaluation  Clinical Impression: Pt admitted with above diagnosis. Pt currently with functional limitations due to the deficits listed below (see PT Problem List). At the time of PT eval pt was able to perform transfers and ambulation with gross min guard assist for balance support and safety. Pt demonstrating decreased tolerance for functional activity and required increased standing rest breaks at end of gait training. Feel HHPT would be beneficial to progress independence with transfer training, reinforce maintenance of precautions, and improve ambulation distance. Pt will benefit from skilled PT to increase their independence and safety with mobility to allow discharge to the venue listed below.      09/15/17 0936  PT Visit Information  Last PT Received On 09/15/17  Assistance Needed +1  History of Present Illness Pt is a 72 y/o female who presents s/p L2-L3 PLIF on 09/14/17. PMH significant for HTN, CAD.   Precautions  Precautions Fall;Back  Precaution Booklet Issued Yes (comment)  Precaution Comments Handout reviewed in detail. Pt was cued for precautions during functional mobility.   Required Braces or Orthoses Spinal Brace  Spinal Brace Lumbar corset;Applied in sitting position  Restrictions  Weight Bearing Restrictions No  Home Living  Family/patient expects to be discharged to: Private residence  Living Arrangements Alone  Available Help at Discharge Family;Available 24 hours/day (Daughter and 2 sons close by. Dtr will be 24 hours initially)  Type of [place] to enter  Entrance Stairs-Number of Steps 2  Entrance Stairs-Rails Right;Left;Can reach both  Home Layout One level  Bathroom Shower/Tub Tub/shower unit  Tax adviser - 2 wheels;Cane - single point;Shower seat;Grab bars - tub/shower  Prior Function  Level of Independence Independent with assistive device(s)  Comments Pt reports using cane for  support prior to surgery.   Communication  Communication No difficulties  Pain Assessment  Pain Assessment 0-10  Pain Score 5  Pain Location Incision site  Pain Descriptors / Indicators Operative site guarding;Sore  Pain Intervention(s) Limited activity within patient's tolerance;Monitored during session;Repositioned  Cognition  Arousal/Alertness Awake/alert  Behavior During Therapy WFL for tasks assessed/performed  Overall Cognitive Status Within Functional Limits for tasks assessed  Upper Extremity Assessment  Upper Extremity Assessment Defer to OT evaluation  Lower Extremity Assessment  Lower Extremity Assessment LLE deficits/detail  LLE Deficits / Details Decreased strength and AROM consistent with pre-op diagnosis.   Cervical / Trunk Assessment  Cervical / Trunk Assessment Other exceptions  Cervical / Trunk Exceptions s/p surgery  Bed Mobility  General bed mobility comments Pt sitting EOB with brace donned when PT arrived. Pt doffed and donned brace with min assist to practice prior to progressing mobility.   Transfers  Overall transfer level Needs assistance  Equipment used Rolling walker (2 wheeled)  Transfers Sit to/from Stand  Sit to Stand Min guard  General transfer comment Increased time required to initiate. Pt was able to power-up to full stand without assistance, however hands on guarding provided for safety. VC's for hand placement on seated surface for safety.   Ambulation/Gait  Ambulation/Gait assistance Min guard  Ambulation Distance (Feet) 200 Feet  Assistive device Rolling walker (2 wheeled)  Gait Pattern/deviations Step-through pattern;Decreased stride length;Trunk flexed  General Gait Details VC's for improved posture and general safety with the RW. Youth walker required due to 4'9" height. Pt ambulating generally slow but steady with BUE support.   Gait velocity Decreased  Gait velocity interpretation 1.31 - 2.62 ft/sec, indicative of limited  community  ambulator  Stairs Yes  Stairs assistance Min guard  Stair Management Two rails;Step to pattern;Forwards;Backwards  Number of Stairs 2  General stair comments Pt ascended stairs facing forwards, and descended back down backwards with BUE support. Pt was able to complete without assistance however hands on guarding provided for safety.   Balance  Overall balance assessment Needs assistance  Sitting-balance support Feet supported;No upper extremity supported  Sitting balance-Leahy Scale Fair  Standing balance support Bilateral upper extremity supported;During functional activity  Standing balance-Leahy Scale Poor  Standing balance comment Reliant on UE support  PT - End of Session  Equipment Utilized During Treatment Gait belt;Back brace  Activity Tolerance Patient limited by fatigue  Patient left with call bell/phone within reach (Sitting EOB)  Nurse Communication Mobility status  PT Assessment  PT Recommendation/Assessment Patient needs continued PT services  PT Visit Diagnosis Unsteadiness on feet (R26.81);Pain;Other symptoms and signs involving the nervous system (R29.898)  Pain - part of body  (back)  PT Problem List Decreased strength;Decreased range of motion;Decreased activity tolerance;Decreased balance;Decreased mobility;Decreased knowledge of use of DME;Decreased safety awareness;Decreased knowledge of precautions;Pain  PT Plan  PT Frequency (ACUTE ONLY) Min 5X/week  PT Treatment/Interventions (ACUTE ONLY) DME instruction;Gait training;Stair training;Functional mobility training;Therapeutic activities;Therapeutic exercise;Neuromuscular re-education;Patient/family education  AM-PAC PT "6 Clicks" Daily Activity Outcome Measure  Difficulty turning over in bed (including adjusting bedclothes, sheets and blankets)? 3  Difficulty moving from lying on back to sitting on the side of the bed?  3  Difficulty sitting down on and standing up from a chair with arms (e.g., wheelchair,  bedside commode, etc,.)? 3  Help needed moving to and from a bed to chair (including a wheelchair)? 3  Help needed walking in hospital room? 3  Help needed climbing 3-5 steps with a railing?  3  6 Click Score 18  Mobility G Code  CK  PT Recommendation  Follow Up Recommendations Home health PT;Supervision for mobility/OOB  PT equipment None recommended by PT  Individuals Consulted  Consulted and Agree with Results and Recommendations Patient  Acute Rehab PT Goals  Patient Stated Goal Home at d/c  PT Goal Formulation With patient  Time For Goal Achievement 09/22/17  Potential to Achieve Goals Good  PT Time Calculation  PT Start Time (ACUTE ONLY) 6384  PT Stop Time (ACUTE ONLY) 0859  PT Time Calculation (min) (ACUTE ONLY) 22 min  PT General Charges  $$ ACUTE PT VISIT 1 Visit  PT Evaluation  $PT Eval Moderate Complexity 1 Mod   Rolinda Roan, PT, DPT Acute Rehabilitation Services Pager: [phone]

## 2017-09-15 NOTE — Discharge Summary (Signed)
Physician Discharge Summary  Patient ID: Michele Mcgee MRN: [id number] DOB/AGE: 73-Sep-1947 73 y.o.  Admit date: 09/14/2017 Discharge date: 09/15/2017  Admission Diagnoses: L3-4 spondylolisthesis, L2-3 and L3-4 spinal stenosis, lumbago, lumbar radiculopathy, neurogenic claudication  Discharge Diagnoses: The same Active Problems:   Spondylolisthesis of lumbar region   Discharged Condition: good  Hospital Course: I performed an L2-3 and L3-4 decompression with instrumentation and fusion at L3-4 on the patient on 09/14/2017.  The surgery went well.  The patient's postoperative course was unremarkable.  On postoperative day #1 she requested discharge her home.  The patient, and her family, were given written and oral discharge instructions.  Arrangements were made for home physical therapy and Occupational Therapy.  All their questions were answered.  Consults: PT, OT Significant Diagnostic Studies: None Treatments: L2-3 and L3-4 laminectomy/laminotomy, L3-4 instrumentation and fusion. Discharge Exam: Blood pressure (!) 103/42, pulse 81, temperature 98.4 F (36.9 C), temperature source Oral, resp. rate 16, height 4\' 9"  (1.448 m), weight 113.2 kg (249 lb 9.6 oz), SpO2 98 %. The patient is alert and pleasant.  She looks well.  Her strength is normal.  Disposition: Home  Discharge Instructions    Call MD for:  difficulty breathing, headache or visual disturbances   Complete by:  As directed    Call MD for:  extreme fatigue   Complete by:  As directed    Call MD for:  hives   Complete by:  As directed    Call MD for:  persistant dizziness or light-headedness   Complete by:  As directed    Call MD for:  persistant nausea and vomiting   Complete by:  As directed    Call MD for:  redness, tenderness, or signs of infection (pain, swelling, redness, odor or green/yellow discharge around incision site)   Complete by:  As directed    Call MD for:  severe uncontrolled pain   Complete by:  As  directed    Call MD for:  temperature >100.4   Complete by:  As directed    Diet - low sodium heart healthy   Complete by:  As directed    Discharge instructions   Complete by:  As directed    Call [phone] for a followup appointment. Take a stool softener while you are using pain medications.   Driving Restrictions   Complete by:  As directed    Do not drive for 2 weeks.   Increase activity slowly   Complete by:  As directed    Lifting restrictions   Complete by:  As directed    Do not lift more than 5 pounds. No excessive bending or twisting.   May shower / Bathe   Complete by:  As directed    Remove the dressing for 3 days after surgery.  You may shower, but leave the incision alone.   Remove dressing in 48 hours   Complete by:  As directed    Your stitches are under the scan and will dissolve by themselves. The Steri-Strips will fall off after you take a few showers. Do not rub back or pick at the wound, Leave the wound alone.     Allergies as of 09/15/2017      Reactions   Ampicillin Rash   Has patient had a PCN reaction causing immediate rash, facial/tongue/throat swelling, SOB or lightheadedness with hypotension: No Has patient had a PCN reaction causing severe rash involving mucus membranes or skin necrosis: No Has patient had a PCN  reaction that required hospitalization: No Has patient had a PCN reaction occurring within the last 10 years: No If all of the above answers are "NO", then may proceed with Cephalosporin use.   Fosamax [alendronate] Other (See Comments)   Pt does not recall what reaction was   Sulfa Antibiotics    "I don't know anything about this"   Sulfasalazine Other (See Comments)   unknown      Medication List    TAKE these medications   atorvastatin 40 MG tablet Commonly known as:  LIPITOR Take 40 mg by mouth every evening.   CALCIUM 600+D3 PO Take 1 tablet by mouth 2 (two) times daily.   cyclobenzaprine 10 MG tablet Commonly known as:   FLEXERIL Take 1 tablet (10 mg total) by mouth 3 (three) times daily as needed for muscle spasms.   docusate sodium 100 MG capsule Commonly known as:  COLACE Take 100 mg by mouth daily.   escitalopram 20 MG tablet Commonly known as:  LEXAPRO Take 20 mg by mouth daily.   esomeprazole 40 MG capsule Commonly known as:  NEXIUM Take 40 mg by mouth daily before breakfast. 30 minutes to 1 hour prior to breakfast   fluticasone 50 MCG/ACT nasal spray Commonly known as:  FLONASE Place 1 spray into both nostrils daily as needed for allergies.   lisinopril-hydrochlorothiazide 10-12.5 MG tablet Commonly known as:  PRINZIDE,ZESTORETIC Take 1 tablet by mouth daily.   metoprolol succinate 25 MG 24 hr tablet Commonly known as:  TOPROL-XL Take 25 mg by mouth daily.   multivitamin with minerals Tabs tablet Take 1 tablet by mouth daily. Centrum Silver for Women 50+   oxyCODONE 5 MG immediate release tablet Commonly known as:  Oxy IR/ROXICODONE Take 1 tablet (5 mg total) by mouth every 4 (four) hours as needed for moderate pain ((score 4 to 6)).   ST JOSEPH ASPIRIN 81 MG EC tablet Generic drug:  aspirin Take 81 mg by mouth daily. Swallow whole.   torsemide 20 MG tablet Commonly known as:  DEMADEX Take 20 mg by mouth daily as needed (for swelling in feet).        Signed: Ophelia Charter 09/15/2017, 12:12 PM

## 2017-09-15 NOTE — Progress Notes (Signed)
Patient alert and oriented, mae's well, voiding adequate amount of urine, swallowing without difficulty, c/o mild pain at time of discharge. Patient discharged home with family. Script and discharged instructions given to patient. Patient and family stated understanding of instructions given. Patient has an appointment with Dr. Jenkins 

## 2017-09-15 NOTE — Progress Notes (Signed)
Occupational Therapy Treatment Patient Details Name: Michele Mcgee MRN: [id number] DOB: [birth year] Today's Date: 09/15/2017    History of present illness Pt is a 72 y/o female who presents s/p L2-L3 PLIF on 09/14/17. PMH significant for HTN, CAD.    OT comments  Session focused on family/caregiver education. Pt demonstrated good carry over from OT eval earlier today, providing verbal instruction to family for use of AE for ADLs. Pt and family initiated agreement to [facility]. All further occupational therapy concerns can be addressed by [place].    Follow Up Recommendations  Home health OT;Supervision/Assistance - 24 hour    Equipment Recommendations  None recommended by OT    Recommendations for Other Services PT consult    Precautions / Restrictions Precautions Precautions: Fall;Back Precaution Booklet Issued: Yes (comment) Precaution Comments: VC to adhere to precautions Required Braces or Orthoses: Spinal Brace Spinal Brace: Lumbar corset;Applied in sitting position Restrictions Weight Bearing Restrictions: No       Mobility Bed Mobility               General bed mobility comments: pt sitting in bed upon arrival   Transfers              Balance                                 ADL either performed or assessed with clinical judgement   ADL                 Vision      Perception     Praxis      Cognition                                       Exercises     Shoulder Instructions       General Comments educated pt's son/daughter on AE; good carryover from OT eval,pt able to provide verbal instruction to use AE; family and pt want HHOT;educated pt/family on environmental modifications to reduce risk of falls;family demonstrated good understanding of precautions,AE,and environmental modifications    Pertinent Vitals/ Pain       Pain Assessment: 0-10 Pain Score: 4  Faces Pain Scale: Hurts little more Pain  Location: incision site Pain Descriptors / Indicators: Discomfort Pain Intervention(s): Limited activity within patient's tolerance  Home Living Family/patient expects to be discharged to:: Private residence Living Arrangements: Alone Available Help at Discharge: Family;Available 24 hours/day Type of Home: House Home Access: Stairs to enter [employer] of Steps: 2 Entrance Stairs-Rails: Right;Left;Can reach both Home Layout: One level     Bathroom Shower/Tub: Teacher, early years/pre: Standard Bathroom Accessibility: Yes How Accessible: Accessible via walker Home Equipment: Roxie - 2 wheels;Cane - single point;Shower seat;Grab bars - tub/shower          Prior Functioning/Environment Level of Independence: Independent with assistive device(s)        Comments: Pt reports using cane for support prior to surgery.    Frequency  Min 2X/week        Progress Toward Goals  OT Goals(current goals can now be found in the care plan section)  Progress towards OT goals: Progressing toward goals  Acute Rehab OT Goals Patient Stated Goal: to go home OT Goal Formulation: With patient/family Time For Goal Achievement: 09/29/17 Potential to Achieve Goals: Good ADL  Goals Pt Will Perform Lower Body Dressing: with modified independence Pt Will Transfer to Toilet: with modified independence;ambulating;grab bars Additional ADL Goal #1: Pt/caregiver will return demonstrate use of AE independently.  Plan Discharge plan remains appropriate    Co-evaluation                 AM-PAC PT "6 Clicks" Daily Activity     Outcome Measure   Help from another person eating meals?: None Help from another person taking care of personal grooming?: A Little Help from another person toileting, which includes using toliet, bedpan, or urinal?: A Little Help from another person bathing (including washing, rinsing, drying)?: A Little Help from another person to put on and  taking off regular upper body clothing?: None Help from another person to put on and taking off regular lower body clothing?: A Little 6 Click Score: 20    End of Session Equipment Utilized During Treatment: Gait belt;Rolling walker;Back brace  OT Visit Diagnosis: Unsteadiness on feet (R26.81);Other abnormalities of gait and mobility (R26.89)   Activity Tolerance Patient tolerated treatment well   Patient Left in bed;with family/visitor present   Nurse Communication Mobility status        Time: 5883-2549 OT Time Calculation (min): 15 min  Charges: OT General Charges $OT Visit: 1 Visit OT Evaluation $OT Eval Moderate Complexity: 1 Mod OT Treatments $Self Care/Home Management : 8-22 mins  Michele Mcgee OTS   09/15/2017, 1:31 PM

## 2017-09-15 NOTE — Progress Notes (Signed)
OT Note Addendum    09/15/17 1124  OT Visit Information  Last OT Received On 09/15/17  OT Time Calculation  OT Start Time (ACUTE ONLY) 0914  OT Stop Time (ACUTE ONLY) 0947  OT Time Calculation (min) 33 min  OT General Charges  $OT Visit 1 Visit  OT Evaluation  $OT Eval Moderate Complexity 1 Mod  OT Treatments  $Self Care/Home Management  8-22 mins  Maurie Boettcher, OT/L  OT Clinical Specialist [phone]

## 2017-09-16 ENCOUNTER — Emergency Department: Payer: Medicare Other

## 2017-09-16 ENCOUNTER — Emergency Department
Admission: EM | Admit: 2017-09-16 | Discharge: 2017-09-16 | Disposition: A | Discharge status: Other Acute Inpatient Hospital | Payer: Medicare Other | Attending: Neurosurgery | Admitting: Neurosurgery

## 2017-09-16 ENCOUNTER — Inpatient Hospital Stay (HOSPITAL_COMMUNITY): Payer: Medicare Other

## 2017-09-16 ENCOUNTER — Encounter (HOSPITAL_COMMUNITY): Payer: Self-pay | Admitting: Physician Assistant

## 2017-09-16 ENCOUNTER — Inpatient Hospital Stay (HOSPITAL_COMMUNITY)
Admission: EM | Admit: 2017-09-16 | Discharge: 2017-09-19 | DRG: 948 | Disposition: A | Discharge status: Skilled Nursing Facility | Payer: Medicare Other | Attending: Neurosurgery | Admitting: Neurosurgery

## 2017-09-16 ENCOUNTER — Encounter: Payer: Self-pay | Admitting: Emergency Medicine

## 2017-09-16 ENCOUNTER — Other Ambulatory Visit: Payer: Self-pay

## 2017-09-16 DIAGNOSIS — G8918 Other acute postprocedural pain: Secondary | ICD-10-CM | POA: Insufficient documentation

## 2017-09-16 DIAGNOSIS — M545 Low back pain: Secondary | ICD-10-CM | POA: Diagnosis present

## 2017-09-16 DIAGNOSIS — Z6841 Body Mass Index (BMI) 40.0 and over, adult: Secondary | ICD-10-CM | POA: Diagnosis not present

## 2017-09-16 DIAGNOSIS — I2510 Atherosclerotic heart disease of native coronary artery without angina pectoris: Secondary | ICD-10-CM | POA: Insufficient documentation

## 2017-09-16 DIAGNOSIS — Z7982 Long term (current) use of aspirin: Secondary | ICD-10-CM | POA: Insufficient documentation

## 2017-09-16 DIAGNOSIS — I10 Essential (primary) hypertension: Secondary | ICD-10-CM | POA: Diagnosis present

## 2017-09-16 DIAGNOSIS — Z79899 Other long term (current) drug therapy: Secondary | ICD-10-CM | POA: Diagnosis not present

## 2017-09-16 DIAGNOSIS — K219 Gastro-esophageal reflux disease without esophagitis: Secondary | ICD-10-CM | POA: Diagnosis present

## 2017-09-16 DIAGNOSIS — E7800 Pure hypercholesterolemia, unspecified: Secondary | ICD-10-CM | POA: Diagnosis present

## 2017-09-16 DIAGNOSIS — R0902 Hypoxemia: Secondary | ICD-10-CM

## 2017-09-16 DIAGNOSIS — Z981 Arthrodesis status: Secondary | ICD-10-CM | POA: Diagnosis not present

## 2017-09-16 DIAGNOSIS — J9811 Atelectasis: Secondary | ICD-10-CM | POA: Diagnosis present

## 2017-09-16 LAB — CBC WITH DIFFERENTIAL/PLATELET
BASOS PCT: 0 %
Basophils Absolute: 0 10*3/uL (ref 0–0.1)
EOS ABS: 0 10*3/uL (ref 0–0.7)
Eosinophils Relative: 0 %
HEMATOCRIT: 30.8 % — AB (ref 35.0–47.0)
HEMOGLOBIN: 10.7 g/dL — AB (ref 12.0–16.0)
LYMPHS ABS: 1.5 10*3/uL (ref 1.0–3.6)
Lymphocytes Relative: 15 %
MCH: 32.3 pg (ref 26.0–34.0)
MCHC: 34.8 g/dL (ref 32.0–36.0)
MCV: 92.7 fL (ref 80.0–100.0)
MONO ABS: 1.4 10*3/uL — AB (ref 0.2–0.9)
MONOS PCT: 14 %
NEUTROS ABS: 7.1 10*3/uL — AB (ref 1.4–6.5)
NEUTROS PCT: 71 %
Platelets: 139 10*3/uL — ABNORMAL LOW (ref 150–440)
RBC: 3.32 MIL/uL — ABNORMAL LOW (ref 3.80–5.20)
RDW: 14.2 % (ref 11.5–14.5)
WBC: 10 10*3/uL (ref 3.6–11.0)

## 2017-09-16 LAB — BASIC METABOLIC PANEL
Anion gap: 12 (ref 5–15)
BUN: 30 mg/dL — ABNORMAL HIGH (ref 6–20)
CALCIUM: 8.4 mg/dL — AB (ref 8.9–10.3)
CHLORIDE: 96 mmol/L — AB (ref 101–111)
CO2: 27 mmol/L (ref 22–32)
CREATININE: 0.91 mg/dL (ref 0.44–1.00)
GFR calc non Af Amer: >60 mL/min (ref >60)
Glucose, Bld: 105 mg/dL — ABNORMAL HIGH (ref 65–99)
Potassium: 3.6 mmol/L (ref 3.5–5.1)
Sodium: 135 mmol/L (ref 135–145)

## 2017-09-16 LAB — PROCALCITONIN

## 2017-09-16 MED ORDER — MORPHINE SULFATE (PF) 4 MG/ML IV SOLN
4.0000 mg | Freq: Once | INTRAVENOUS | Status: AC
  Administered 2017-09-16: 4 mg via INTRAVENOUS
  Filled 2017-09-16: qty 1

## 2017-09-16 MED ORDER — TORSEMIDE 20 MG PO TABS: 20.0000 mg | ORAL_TABLET | Freq: Every day | ORAL | Status: DC | PRN

## 2017-09-16 MED ORDER — SODIUM CHLORIDE 0.9 % IV BOLUS: 1000.0000 mL | Freq: Once | INTRAVENOUS | Status: DC

## 2017-09-16 MED ORDER — LISINOPRIL-HYDROCHLOROTHIAZIDE 10-12.5 MG PO TABS: 1.0000 | ORAL_TABLET | Freq: Every day | ORAL | Status: DC

## 2017-09-16 MED ORDER — SODIUM CHLORIDE 0.9% FLUSH: 3.0000 mL | INTRAVENOUS | Status: DC | PRN

## 2017-09-16 MED ORDER — ZOLPIDEM TARTRATE 5 MG PO TABS: 5.0000 mg | ORAL_TABLET | Freq: Every evening | ORAL | Status: DC | PRN

## 2017-09-16 MED ORDER — ACETAMINOPHEN 325 MG PO TABS: 650.0000 mg | ORAL_TABLET | Freq: Four times a day (QID) | ORAL | Status: DC | PRN

## 2017-09-16 MED ORDER — METOPROLOL SUCCINATE ER 25 MG PO TB24
25.0000 mg | ORAL_TABLET | Freq: Every day | ORAL | Status: DC
  Administered 2017-09-17 – 2017-09-19 (×2): 25 mg via ORAL
  Filled 2017-09-16 (×3): qty 1

## 2017-09-16 MED ORDER — FLUTICASONE PROPIONATE 50 MCG/ACT NA SUSP
1.0000 | Freq: Every day | NASAL | Status: DC | PRN
  Filled 2017-09-16: qty 16

## 2017-09-16 MED ORDER — CYCLOBENZAPRINE HCL 10 MG PO TABS
10.0000 mg | ORAL_TABLET | Freq: Three times a day (TID) | ORAL | Status: DC | PRN
  Administered 2017-09-18 – 2017-09-19 (×4): 10 mg via ORAL
  Filled 2017-09-16 (×4): qty 1

## 2017-09-16 MED ORDER — SODIUM CHLORIDE 0.9% FLUSH
3.0000 mL | Freq: Two times a day (BID) | INTRAVENOUS | Status: DC
  Administered 2017-09-16 – 2017-09-18 (×2): 3 mL via INTRAVENOUS

## 2017-09-16 MED ORDER — ACETAMINOPHEN 650 MG RE SUPP: 650.0000 mg | Freq: Four times a day (QID) | RECTAL | Status: DC | PRN

## 2017-09-16 MED ORDER — CALCIUM CARB-CHOLECALCIFEROL 600-200 MG-UNIT PO TABS: ORAL_TABLET | Freq: Two times a day (BID) | ORAL | Status: DC

## 2017-09-16 MED ORDER — DOCUSATE SODIUM 100 MG PO CAPS
100.0000 mg | ORAL_CAPSULE | Freq: Every day | ORAL | Status: DC
  Administered 2017-09-17 – 2017-09-19 (×3): 100 mg via ORAL
  Filled 2017-09-16 (×3): qty 1

## 2017-09-16 MED ORDER — HYDROMORPHONE HCL 2 MG/ML IJ SOLN: 1.0000 mg | INTRAMUSCULAR | Status: DC | PRN

## 2017-09-16 MED ORDER — OXYCODONE HCL 5 MG PO TABS
5.0000 mg | ORAL_TABLET | ORAL | Status: DC | PRN
  Administered 2017-09-16 – 2017-09-18 (×7): 10 mg via ORAL
  Filled 2017-09-16 (×7): qty 2

## 2017-09-16 MED ORDER — SENNA 8.6 MG PO TABS
1.0000 | ORAL_TABLET | Freq: Two times a day (BID) | ORAL | Status: DC
  Administered 2017-09-16 – 2017-09-18 (×5): 8.6 mg via ORAL
  Filled 2017-09-16 (×5): qty 1

## 2017-09-16 MED ORDER — METHOCARBAMOL 1000 MG/10ML IJ SOLN: 500.0000 mg | Freq: Four times a day (QID) | INTRAVENOUS | Status: DC

## 2017-09-16 MED ORDER — ATORVASTATIN CALCIUM 40 MG PO TABS
40.0000 mg | ORAL_TABLET | Freq: Every evening | ORAL | Status: DC
  Administered 2017-09-16 – 2017-09-18 (×3): 40 mg via ORAL
  Filled 2017-09-16 (×3): qty 1

## 2017-09-16 MED ORDER — BISACODYL 10 MG RE SUPP
10.0000 mg | Freq: Every day | RECTAL | Status: DC | PRN
  Administered 2017-09-18: 10 mg via RECTAL
  Filled 2017-09-16 (×2): qty 1

## 2017-09-16 MED ORDER — LISINOPRIL 10 MG PO TABS
10.0000 mg | ORAL_TABLET | Freq: Every day | ORAL | Status: DC
  Administered 2017-09-17: 10 mg via ORAL
  Filled 2017-09-16 (×3): qty 1

## 2017-09-16 MED ORDER — SODIUM CHLORIDE 0.9 % IV SOLN: 250.0000 mL | INTRAVENOUS | Status: DC | PRN

## 2017-09-16 MED ORDER — MAGNESIUM CITRATE PO SOLN
1.0000 | Freq: Once | ORAL | Status: AC | PRN
  Administered 2017-09-17: 1 via ORAL
  Filled 2017-09-16: qty 296

## 2017-09-16 MED ORDER — ESCITALOPRAM OXALATE 10 MG PO TABS
20.0000 mg | ORAL_TABLET | Freq: Every day | ORAL | Status: DC
  Administered 2017-09-16 – 2017-09-19 (×4): 20 mg via ORAL
  Filled 2017-09-16 (×4): qty 2

## 2017-09-16 MED ORDER — SODIUM CHLORIDE 0.9% FLUSH
3.0000 mL | Freq: Two times a day (BID) | INTRAVENOUS | Status: DC
  Administered 2017-09-16 – 2017-09-19 (×4): 3 mL via INTRAVENOUS

## 2017-09-16 MED ORDER — ONDANSETRON HCL 4 MG PO TABS: 4.0000 mg | ORAL_TABLET | Freq: Four times a day (QID) | ORAL | Status: DC | PRN

## 2017-09-16 MED ORDER — PANTOPRAZOLE SODIUM 40 MG PO TBEC
80.0000 mg | DELAYED_RELEASE_TABLET | Freq: Every day | ORAL | Status: DC
  Administered 2017-09-17 – 2017-09-19 (×3): 80 mg via ORAL
  Filled 2017-09-16 (×3): qty 2

## 2017-09-16 MED ORDER — HYDROMORPHONE HCL 1 MG/ML IJ SOLN: 1.0000 mg | INTRAMUSCULAR | Status: DC | PRN

## 2017-09-16 MED ORDER — OXYCODONE HCL ER 10 MG PO T12A: 10.0000 mg | EXTENDED_RELEASE_TABLET | Freq: Two times a day (BID) | ORAL | Status: DC

## 2017-09-16 MED ORDER — HYDROCHLOROTHIAZIDE 12.5 MG PO CAPS
12.5000 mg | ORAL_CAPSULE | Freq: Every day | ORAL | Status: DC
  Administered 2017-09-17: 12.5 mg via ORAL
  Filled 2017-09-16 (×3): qty 1

## 2017-09-16 MED ORDER — ONDANSETRON HCL 4 MG/2ML IJ SOLN: 4.0000 mg | Freq: Four times a day (QID) | INTRAMUSCULAR | Status: DC | PRN

## 2017-09-16 MED ORDER — SENNOSIDES-DOCUSATE SODIUM 8.6-50 MG PO TABS
1.0000 | ORAL_TABLET | Freq: Every evening | ORAL | Status: DC | PRN
  Administered 2017-09-17: 1 via ORAL
  Filled 2017-09-16 (×2): qty 1

## 2017-09-16 MED ORDER — ADULT MULTIVITAMIN W/MINERALS CH: 1.0000 | ORAL_TABLET | Freq: Every day | ORAL | Status: DC

## 2017-09-16 MED ORDER — HYDROCODONE-ACETAMINOPHEN 5-325 MG PO TABS
1.0000 | ORAL_TABLET | ORAL | Status: DC | PRN
  Administered 2017-09-16 – 2017-09-19 (×7): 2 via ORAL
  Filled 2017-09-16 (×7): qty 2

## 2017-09-16 MED ORDER — CALCIUM CARBONATE-VITAMIN D 500-200 MG-UNIT PO TABS
1.0000 | ORAL_TABLET | Freq: Every day | ORAL | Status: DC
  Administered 2017-09-17 – 2017-09-19 (×3): 1 via ORAL
  Filled 2017-09-16 (×3): qty 1

## 2017-09-16 MED FILL — Heparin Sodium (Porcine) Inj 1000 Unit/ML: INTRAMUSCULAR | Qty: 30 | Status: AC

## 2017-09-16 MED FILL — Sodium Chloride IV Soln 0.9%: INTRAVENOUS | Qty: 1000 | Status: AC

## 2017-09-16 NOTE — ED Triage Notes (Signed)
Pt BIB [facility] from [place] for follow-up with surgery. Pt released from [facility] yesterday  after back surgery and went to [place] d/t back pain not relieved by the hydrocodone they sent her home on. PIV established at [facility], pt received morphine IV, now rating pain 5/10 at this time. A&Ox4.

## 2017-09-16 NOTE — ED Notes (Signed)
Unable to void, additional pads placed for pts comfort

## 2017-09-16 NOTE — ED Notes (Signed)
Per EDP, no fluids initiated prior to transfer. XR shows some possible fluid in pt's lungs which could account for low O2 sat. No fluids started at this time.

## 2017-09-16 NOTE — Anesthesia Postprocedure Evaluation (Signed)
Anesthesia Post Note  Patient: Michele Mcgee  Procedure(s) Performed: POSTERIOR LUMBAR INTERBODY FUSION, INTERBODY PROSTHESIS, POSTERIOR INSTRUMENTATION AND FUSION LUMBAR THREE- LUMBAR FOUR, LUMBAR TWO- LUMBAR THREE LAMINOTOMIES (N/A )     Patient location during evaluation: PACU Anesthesia Type: General Level of consciousness: sedated and patient cooperative Pain management: pain level controlled Vital Signs Assessment: post-procedure vital signs reviewed and stable Respiratory status: spontaneous breathing Cardiovascular status: stable Anesthetic complications: no    Last Vitals:  Vitals:   09/15/17 0735 09/15/17 1153  BP: (!) 99/41 (!) 103/42  Pulse: 68 81  Resp: 16 16  Temp: 36.9 C 36.9 C  SpO2: 92% 98%    Last Pain:  Vitals:   09/15/17 1430  TempSrc:   PainSc: [place]

## 2017-09-16 NOTE — H&P (Addendum)
Chief Complaint   Chief Complaint  Patient presents with  . Post-Surgical Pain    HPI   HPI: Michele Mcgee is a 72 y.o. female who presented to [facility] ED earlier today due to uncontrolled back pain. She is S/P L2-3 and L3-4 decompression with instrumentation by Dr Arnoldo Morale on 09/14/2017. She had an unremarkable post operative course and was d/c home yesterday, 09/15/17 in stable condition. Unfortunately, when she got home her pain increased in severity. She tried taking rx medes oxy 8m q 3 hours and flexeril which did not help. Pain was so severe, she went to ER. Her pain affects lower back and radiates into hips. Prior to surgery, she was having pains that radiated into b/l feet and associated with n/t. This has resolved - states this is a new pain.Due to severity of pain she does not feel as though she can walk, although endorses full strength of BLE.  Denies bowel or bladder dysfunction. Family unable to care for her needs currently.   [facility] with mild interstitial prominence & trace left pleural effusion vs. Pleural thickening. Her O2 on arrival was 87% on RA so she was placed on 2L [place] which improved sats to 93%. She denies any chest pain, shortness of breath or cough. Endorses chronic LE edema, no worsening. Denies calf pain. Does complain of abdominal pain which she attributes to gas.   Patient Active Problem List   Diagnosis Date Noted  . Spondylolisthesis of lumbar region 09/14/2017    PMH: Past Medical History:  Diagnosis Date  . Anxiety   . Arthritis   . Choledocholithiasis   . Coronary artery disease   . Female bladder prolapse   . GERD (gastroesophageal reflux disease)   . Hypercholesterolemia   . Hypertension   . PONV (postoperative nausea and vomiting)   . Spondylolisthesis of lumbar region     PSH: Past Surgical History:  Procedure Laterality Date  . ABDOMINAL HYSTERECTOMY    . BACK SURGERY    . Bladder  Tacking Surgery    . COLONOSCOPY    . COLONOSCOPY WITH PROPOFOL N/A 12/09/2014   Procedure: COLONOSCOPY WITH PROPOFOL;  Surgeon: RManya Silvas MD;  [facility]  Service: Endoscopy;  Laterality: N/A;  . ERCP    . ESOPHAGEAL DILATION    . ESOPHAGOGASTRODUODENOSCOPY    . LAMINECTOMY    . TUBAL LIGATION    . VEIN LIGATION AND STRIPPING       (Not in a hospital admission)  SH: Social History   Tobacco Use  . Smoking status: Never Smoker  . Smokeless tobacco: Never Used  Substance Use Topics  . Alcohol use: Never    Frequency: Never  . Drug use: Never    MEDS: Prior to Admission medications   Medication Sig Start Date End Date Taking? Authorizing Provider  aspirin (ST JOSEPH ASPIRIN) 81 MG EC tablet Take 81 mg by mouth daily. Swallow whole.   Yes [provider]  atorvastatin (LIPITOR) 40 MG tablet Take 40 mg by mouth every evening.   Yes [provider]  Calcium Carb-Cholecalciferol (CALCIUM 600+D3 PO) Take 1 tablet by mouth 2 (two) times daily.   Yes [provider]  cyclobenzaprine (FLEXERIL) 10 MG tablet Take 1 tablet (10 mg total) by mouth 3 (three) times daily as needed for muscle spasms. 09/15/17  Yes JNewman Pies MD  docusate sodium (COLACE) 100 MG capsule Take 100 mg by  mouth daily.   Yes [provider]  escitalopram (LEXAPRO) 20 MG tablet Take 20 mg by mouth daily.   Yes [provider]  esomeprazole (NEXIUM) 40 MG capsule Take 40 mg by mouth daily before breakfast. 30 minutes to 1 hour prior to breakfast   Yes [provider]  fluticasone (FLONASE) 50 MCG/ACT nasal spray Place 1 spray into both nostrils daily as needed for allergies.   Yes [provider]  lisinopril-hydrochlorothiazide (PRINZIDE,ZESTORETIC) 10-12.5 MG per tablet Take 1 tablet by mouth daily.   Yes [provider]  metoprolol succinate (TOPROL-XL) 25 MG 24 hr tablet Take 25 mg by mouth daily.   Yes [provider]  Multiple Vitamin (MULTIVITAMIN WITH MINERALS) TABS tablet Take 1 tablet by mouth daily. Centrum Silver for Women 50+   Yes [provider]  oxyCODONE (OXY IR/ROXICODONE) 5 MG immediate release tablet Take 1 tablet (5 mg total) by mouth every 4 (four) hours as needed for moderate pain ((score 4 to 6)). 09/15/17  Yes Newman Pies, MD  torsemide (DEMADEX) 20 MG tablet Take 20 mg by mouth daily as needed (for swelling in feet).   Yes [provider]    ALLERGY: Allergies  Allergen Reactions  . Ampicillin Rash    Has patient had a PCN reaction causing immediate rash, facial/tongue/throat swelling, SOB or lightheadedness with hypotension: No Has patient had a PCN reaction causing severe rash involving mucus membranes or skin necrosis: No Has patient had a PCN reaction that required hospitalization: No Has patient had a PCN reaction occurring within the last 10 years: No If all of the above answers are "NO", then may proceed with Cephalosporin use.   Marland Kitchen Fosamax [Alendronate] Other (See Comments)    Pt does not recall what reaction was  . Sulfa Antibiotics     "I don't know anything about this"  . Sulfasalazine Other (See Comments)    unknown    Social History   Tobacco Use  . Smoking status: Never Smoker  . Smokeless tobacco: Never Used  Substance Use Topics  . Alcohol use: Never    Frequency: Never     Family History  Problem Relation Age of Onset  . Breast cancer Neg Hx      ROS   Review of Systems  Constitutional: Negative.   HENT: Negative.   Eyes: Negative.   Respiratory: Negative.   Cardiovascular: Negative.   Gastrointestinal: Negative.   Genitourinary: Negative.   Musculoskeletal: Positive for back pain and myalgias. Negative for falls, joint pain and neck pain.  Skin: Negative.   Neurological: Positive for weakness. Negative for dizziness, tingling, tremors, sensory change, speech change, focal weakness, seizures, loss of  consciousness and headaches.    Exam   Vitals:   09/16/17 1530 09/16/17 1600  BP: (!) 106/45 (!) 106/44  Pulse: 82 75  Resp: 18 19  Temp:    SpO2: 90% 96%   General appearance: WDWN, laying flat on stretcher, NAD, nasal cannula in place Eyes: PERRL, Fundoscopic: normal Cardiovascular: Regular rate and rhythm without murmurs, rubs, gallops. No edema or variciosities. Distal pulses normal. Pulmonary: Clear to auscultation Musculoskeletal:    Muscle tone upper extremities: Normal    Muscle tone lower extremities: Normal    Motor exam: Upper Extremities Deltoid Bicep Tricep Grip  Right 5/5 5/5 5/5 5/5  Left 5/5 5/5 5/5 5/5   Lower Extremity IP Quad PF DF EHL  Right 4/5 5/5 5/5 5/5 5/5  Left 4/5 5/5 5/5  5/5 5/5   Neurological Awake, alert, oriented Memory and concentration grossly intact Speech fluent, appropriate CNII: Visual fields normal CNIII/IV/VI: EOMI CNV: Facial sensation normal CNVII: Symmetric, normal strength CNVIII: Grossly normal CNIX: Normal palate movement CNXI: Trap and SCM strength normal CN XII: Tongue protrusion normal Sensation grossly intact to LT DTR: Normal Coordination (finger/nose & heel/shin): Normal  Incision: c/d/i No drainage or signs of infection  +BLE edema. No calf tenderness, redness, warmth. Negative Homans  Results - Imaging/Labs   Results for orders placed or performed during the hospital encounter of 09/16/17 (from the past 48 hour(s))  CBC with Differential     Status: Abnormal   Collection Time: 09/16/17  3:49 PM  Result Value Ref Range   WBC 10.0 3.6 - 11.0 K/uL   RBC 3.32 (L) 3.80 - 5.20 MIL/uL   Hemoglobin 10.7 (L) 12.0 - 16.0 g/dL   HCT 30.8 (L) 35.0 - 47.0 %   MCV 92.7 80.0 - 100.0 fL   MCH 32.3 26.0 - 34.0 pg   MCHC 34.8 32.0 - 36.0 g/dL   RDW 14.2 11.5 - 14.5 %   Platelets 139 (L) 150 - 440 K/uL   Neutrophils Relative % 71 %   Neutro Abs 7.1 (H) 1.4 - 6.5 K/uL   Lymphocytes Relative 15 %   Lymphs Abs 1.5 1.0  - 3.6 K/uL   Monocytes Relative 14 %   Monocytes Absolute 1.4 (H) 0.2 - 0.9 K/uL   Eosinophils Relative 0 %   Eosinophils Absolute 0.0 0 - 0.7 K/uL   Basophils Relative 0 %   Basophils Absolute 0.0 0 - 0.1 K/uL    Comment: Performed at [facility], [address] [place], [place] [address]  Basic metabolic panel     Status: Abnormal   Collection Time: 09/16/17  3:49 PM  Result Value Ref Range   Sodium 135 135 - 145 mmol/L   Potassium 3.6 3.5 - 5.1 mmol/L    Comment: HEMOLYSIS AT THIS LEVEL MAY AFFECT RESULT   Chloride 96 (L) 101 - 111 mmol/L   CO2 27 22 - 32 mmol/L   Glucose, Bld 105 (H) 65 - 99 mg/dL   BUN 30 (H) 6 - 20 mg/dL   Creatinine, Ser 0.91 0.44 - 1.00 mg/dL   Calcium 8.4 (L) 8.9 - 10.3 mg/dL   GFR calc non Af Amer >60 >60 mL/min   GFR calc Af Amer >60 >60 mL/min    Comment: (NOTE) The eGFR has been calculated using the CKD EPI equation. This calculation has not been validated in all clinical situations. eGFR's persistently <60 mL/min signify possible Chronic Kidney Disease.    Anion gap 12 5 - 15    Comment: Performed at [facility], [address] [place],  [address]    Dg Chest 1 View  Result Date: 09/16/2017 CLINICAL DATA:  Hypoxia.  Nonsmoker. EXAM: CHEST  1 VIEW COMPARISON:  Chest radiograph report dated September 16, 2016 though images are not available for direct comparison. FINDINGS: Cardiac silhouette is moderately enlarged. Mediastinal silhouette is not suspicious. Mild interstitial prominence, low inspiratory examination crowded vascular markings. Blunting LEFT costophrenic angle. No focal consolidation. No pneumothorax. Larger body habitus. Osseous structures are non suspicious. Surgical clips in the included right abdomen compatible with cholecystectomy. IMPRESSION: Cardiomegaly. Mild interstitial prominence seen with atypical infection or pulmonary edema. Trace LEFT pleural effusion versus pleural thickening. Electronically Signed    By: Elon Alas M.D.   On: 09/16/2017 15:20  Dg Lumbar Spine 2-3 Views  Result Date: 09/16/2017 CLINICAL DATA:  Low back pain. Status post spinal fusion 2 days ago. EXAM: LUMBAR SPINE - 2-3 VIEW COMPARISON:  09/14/2017, 08/19/2017 and lumbar spine MR dated 05/20/2017. FINDINGS: Interval laminectomy defects and interbody and pedicle screw and rod fusion at the L3-4 level based on the labeling of the levels on the previous MRI. The pedicle screws extend into the superior aspects of the L3 and L4 vertebral bodies, including the area of the superior endplate compression fracture of the L3 vertebral body. Mild anterolisthesis at the L3-4 level, with some improvement. Upper lumbar and lower thoracic spine degenerative changes. Atheromatous arterial calcifications. IMPRESSION: Degenerative and postsurgical changes, as described above. Electronically Signed   By: Claudie Revering M.D.   On: 09/16/2017 15:25    Impression/Plan   72 y.o. female with uncontrolled post op pain S/P L2-3 and L3-4 decompression with instrumentation. She is neurologically intact, with the exception of weakness of bilateral hip flexors secondary to pain.  Will admit for pain control.  Post op pain - Xrays show good hardware positioning. - No red flags on exam or in history suggestive that advanced imaging needs to be performed at this time.  - Will attempt to control pain      - Scheduled Oxycontin 34m BID      - Oxy 5-154mq 3 hours prn.       - Dilaudid 80m62mrn breakthrough      - Robaxin 500m75m QID - Work with PT/OT. May need SNF placement  Hypoxia/interstitial edema/pleural effusion/cardiomegaly - Denies shortness of breath, chest pain, cough although does endorse "gas pains" - Based on recent surgery will r/o PE with CTA chest - Will ask hospitalists to see patient in consult. Paged.

## 2017-09-16 NOTE — ED Notes (Signed)
Attempted to call report

## 2017-09-16 NOTE — ED Provider Notes (Signed)
[facility] Emergency Department Provider Note   ____________________________________________   I have reviewed the triage vital signs and the nursing notes.   HISTORY  Chief Complaint Pain  History limited by: Not Limited, some history obtained from daughter   HPI Michele Mcgee is a 72 y.o. female who presents to the emergency department today because of desire for placement. The patient underwent lumbar surgery 2 days ago, was discharged from the hospital yesterday. Since then daughter states the patient has not been able to move around. The daughter states that she cannot take care of her mother and that she in fact hurt her back. The patient states that the home pain medication is not effective. The patient denies any fevers, shortness of breath, cough, nausea or vomiting.    Per medical record review patient has a history of lumbar surgery performed two days ago at [facility], was discharged yesterday.   Past Medical History:  Diagnosis Date  . Anxiety   . Arthritis   . Choledocholithiasis   . Coronary artery disease   . Female bladder prolapse   . GERD (gastroesophageal reflux disease)   . Hypercholesterolemia   . Hypertension   . PONV (postoperative nausea and vomiting)   . Spondylolisthesis of lumbar region     Patient Active Problem List   Diagnosis Date Noted  . Spondylolisthesis of lumbar region 09/14/2017    Past Surgical History:  Procedure Laterality Date  . ABDOMINAL HYSTERECTOMY    . BACK SURGERY    . Bladder Tacking Surgery    . COLONOSCOPY    . COLONOSCOPY WITH PROPOFOL N/A 12/09/2014   Procedure: COLONOSCOPY WITH PROPOFOL;  Surgeon: Manya Silvas, MD;  Location[facility] ENDOSCOPY;  Service: Endoscopy;  Laterality: N/A;  . ERCP    . ESOPHAGEAL DILATION    . ESOPHAGOGASTRODUODENOSCOPY    . LAMINECTOMY    . TUBAL LIGATION    . VEIN LIGATION AND STRIPPING      Prior to Admission medications   Medication Sig  Start Date End Date Taking? Authorizing Provider  aspirin (ST JOSEPH ASPIRIN) 81 MG EC tablet Take 81 mg by mouth daily. Swallow whole.    [provider]  atorvastatin (LIPITOR) 40 MG tablet Take 40 mg by mouth every evening.    [provider]  Calcium Carb-Cholecalciferol (CALCIUM 600+D3 PO) Take 1 tablet by mouth 2 (two) times daily.    [provider]  cyclobenzaprine (FLEXERIL) 10 MG tablet Take 1 tablet (10 mg total) by mouth 3 (three) times daily as needed for muscle spasms. 09/15/17   Newman Pies, MD  docusate sodium (COLACE) 100 MG capsule Take 100 mg by mouth daily.    [provider]  escitalopram (LEXAPRO) 20 MG tablet Take 20 mg by mouth daily.    [provider]  esomeprazole (NEXIUM) 40 MG capsule Take 40 mg by mouth daily before breakfast. 30 minutes to 1 hour prior to breakfast    [provider]  fluticasone (FLONASE) 50 MCG/ACT nasal spray Place 1 spray into both nostrils daily as needed for allergies.    [provider]  lisinopril-hydrochlorothiazide (PRINZIDE,ZESTORETIC) 10-12.5 MG per tablet Take 1 tablet by mouth daily.    [provider]  metoprolol succinate (TOPROL-XL) 25 MG 24 hr tablet Take 25 mg by mouth daily.    [provider]  Multiple Vitamin (MULTIVITAMIN WITH MINERALS) TABS tablet Take 1 tablet by mouth daily. Centrum Silver for Women 50+  [provider]  oxyCODONE (OXY IR/ROXICODONE) 5 MG immediate release tablet Take 1 tablet (5 mg total) by mouth every 4 (four) hours as needed for moderate pain ((score 4 to 6)). 09/15/17   Newman Pies, MD  torsemide (DEMADEX) 20 MG tablet Take 20 mg by mouth daily as needed (for swelling in feet).    [provider]    Allergies Ampicillin; Fosamax [alendronate]; Sulfa antibiotics; and Sulfasalazine  Family History  Problem Relation Age of Onset  . Breast cancer Neg Hx     Social History Social History    Tobacco Use  . Smoking status: Never Smoker  . Smokeless tobacco: Never Used  Substance Use Topics  . Alcohol use: Never    Frequency: Never  . Drug use: Never    Review of Systems Constitutional: No fever/chills Eyes: No visual changes. ENT: No sore throat. Cardiovascular: Denies chest pain. Respiratory: Denies shortness of breath. Gastrointestinal: No abdominal pain.  No nausea, no vomiting.  No diarrhea.   Genitourinary: Negative for dysuria. Musculoskeletal: Positive for low back pain. Skin: Negative for rash. Neurological: Negative for headaches, focal weakness or numbness.  ____________________________________________   PHYSICAL EXAM:  VITAL SIGNS: ED Triage Vitals  Enc Vitals Group     BP 09/16/17 1437 (!) 93/50     Pulse Rate 09/16/17 1437 77     Resp 09/16/17 1437 18     Temp 09/16/17 1437 99.8 F (37.7 C)     Temp Source 09/16/17 1437 Oral     SpO2 09/16/17 1437 96 %     Weight 09/16/17 1439 250 lb (113.4 kg)     Height 09/16/17 1439 4\' 9"  (1.448 m)     Head Circumference --      Peak Flow --      Pain Score 09/16/17 1438 10   Constitutional: Alert and oriented.  Eyes: Conjunctivae are normal.  ENT      Head: Normocephalic and atraumatic.      Nose: No congestion/rhinnorhea.      Mouth/Throat: Mucous membranes are moist.      Neck: No stridor. Hematological/Lymphatic/Immunilogical: No cervical lymphadenopathy. Cardiovascular: Normal rate, regular rhythm.  No murmurs, rubs, or gallops.  Respiratory: Normal respiratory effort without tachypnea nor retractions. Breath sounds are clear and equal bilaterally. No wheezes/rales/rhonchi. Gastrointestinal: Soft and non tender. No rebound. No guarding.  Genitourinary: Deferred Musculoskeletal: Normal range of motion in all extremities. No lower extremity edema. Neurologic:  Normal speech and language. No gross focal neurologic deficits are appreciated.  Skin:  Skin is warm, dry and intact. No rash  noted. Psychiatric: Mood and affect are normal. Speech and behavior are normal. Patient exhibits appropriate insight and judgment.  ____________________________________________    LABS (pertinent positives/negatives)  CBC wbc 10.0, hgb 10.7, plt 139 BMP na 135, k 3.6, glu 105, cr 0.91  ____________________________________________   EKG  I, Nance Pear, attending physician, personally viewed and interpreted this EKG  EKG Time: 1438 Rate: 77 Rhythm: sinus rhythm Axis: normal Intervals: qtc 485 QRS: narrow ST changes: no st elevation Impression: normal ekg  ____________________________________________    RADIOLOGY  Lumbar spine Post operative changes  CXR Question some edema vs edema  ____________________________________________   PROCEDURES  Procedures  ____________________________________________   INITIAL IMPRESSION / ASSESSMENT AND PLAN / ED COURSE  Pertinent labs & imaging results that were available during my care of the patient were reviewed by me and considered in my medical decision making (see chart for details).   Patient presented  to the emergency department today because of concerns for postoperative pain and inability to care for self at home.  On exam patient does appear slightly uncomfortable.  She was found to be slightly hypoxic in the high 80s on room air.  Chest x-ray was performed which showed possible edema versus atypical infection.  Patient is afebrile no leukocytosis.  She is not complaining of any shortness of breath or chest pain.  This point I think infection less likely.  Will add on a procalcitonin.  Did discuss with [facility] and emergency [facility].  Will plan on transferring her so she could get back under care of her surgery team.  ____________________________________________   FINAL CLINICAL IMPRESSION(S) / ED DIAGNOSES  Final diagnoses:  Hypoxia  Post-operative pain     Note: This dictation was  prepared with Dragon dictation. Any transcriptional errors that result from this process are unintentional     Nance Pear, MD 09/16/17 1624

## 2017-09-16 NOTE — H&P (Deleted)
  The note originally documented on this encounter has been moved the the encounter in which it belongs.  

## 2017-09-16 NOTE — Progress Notes (Signed)
Received call from Dr Archie Balboa regarding patient.  Presented to ER with LBP. She is S/P L2-3 and L3-4 decompression with instrumentation. D/C yesterday. Unfortunately, patient is having extreme difficulties at home with pain control. Will need to be admitted. Possible SNF placement. Patient will be transported via EMS, ED to ED. Please call [phone] when patient arrives for admission

## 2017-09-16 NOTE — ED Notes (Signed)
Pt needs to void. Unable to get up, unable to get on the bed pan by lifting or rolling. Purewick set in place. Pt attempting to void

## 2017-09-16 NOTE — ED Notes (Signed)
Pt desats to 87% on RA. Placed on 2 L [place], O2 sats now 93%. Pt states she is not on home O2. EDP made aware.

## 2017-09-16 NOTE — ED Triage Notes (Signed)
Pt arrives via [facility] from home with complaints of pain in her lower back and bilateral hips. Pt had lumbar surgery Wednesday. Pain increases when she tries to stand up.

## 2017-09-16 NOTE — Progress Notes (Signed)
CTA completed. Per report: No acute findings. No evidence of pleural effusion. Very minimal dependent bibasilar atelectasis. Heart is normal in size.  No acute intervention. Will continue to monitor closely.

## 2017-09-16 NOTE — ED Notes (Signed)
Patient transported to CT 

## 2017-09-17 DIAGNOSIS — G8918 Other acute postprocedural pain: Secondary | ICD-10-CM | POA: Diagnosis not present

## 2017-09-17 NOTE — Progress Notes (Signed)
Patient received alert and oriented x 4, head to toe assessment done by this nurse and Sherlynn Stalls RN, on assessment no pressure ulcer noted. Honey comb dressing to lower back with bruises noted to left lower back.

## 2017-09-17 NOTE — NC FL2 (Addendum)
[place] LEVEL OF CARE SCREENING TOOL     IDENTIFICATION  Patient Name: Michele Mcgee Birthdate: September 19, 1945 Sex: female Admission Date (Current Location): 09/16/2017  [facility] and [place] Number:  Engineering geologist and Address:  The . [facility], [place] [address] [place], [place] [address]      Provider Number: [id number]  Attending Physician Name and Address:  Consuella Lose, MD  Relative Name and Phone Number:  Levie Heritage, daughter [phone]    Current Level of Care: Hospital Recommended Level of Care: [place] Prior Approval Number:    Date Approved/Denied:   PASRR Number:  pending  Discharge Plan: SNF    Current Diagnoses: Patient Active Problem List   Diagnosis Date Noted  . Post-operative pain 09/16/2017  . Spondylolisthesis of lumbar region 09/14/2017    Orientation RESPIRATION BLADDER Height & Weight     Self, Time, Situation, Place  O2(nasal canula 2 l) Incontinent, External catheter Weight: 250 lb (113.4 kg) Height:  4\' 9"  (144.8 cm)  BEHAVIORAL SYMPTOMS/MOOD NEUROLOGICAL BOWEL NUTRITION STATUS      Continent Diet(see discharge summary)  AMBULATORY STATUS COMMUNICATION OF NEEDS Skin   Extensive Assist Verbally Surgical wounds(incision on back)                       Personal Care Assistance Level of Assistance  Bathing, Feeding, Dressing Bathing Assistance: Maximum assistance Feeding assistance: Independent Dressing Assistance: Maximum assistance     Functional Limitations Info  Sight, Hearing, Speech Sight Info: Adequate Hearing Info: Adequate Speech Info: Adequate    SPECIAL CARE FACTORS FREQUENCY  PT (By licensed PT), OT (By licensed OT)     PT Frequency: 5x week OT Frequency: 5x week            Contractures Contractures Info: Not present    Additional Factors Info  Code Status, Allergies, Psychotropic Code Status Info: Full Code Allergies Info:  Ampicillin Psychotropic Info: escitalopram (LEXAPRO) tablet 20 mg daily         Current Medications (09/17/2017):  This is the current hospital active medication list Current Facility-Administered Medications  Medication Dose Route Frequency Provider Last Rate Last Dose  . 0.9 %  sodium chloride infusion  250 mL Intravenous PRN Costella, Vista Mink, PA-C      . acetaminophen (TYLENOL) tablet 650 mg  650 mg Oral Q6H PRN Costella, Vista Mink, PA-C       Or  . acetaminophen (TYLENOL) suppository 650 mg  650 mg Rectal Q6H PRN Costella, Vista Mink, PA-C      . atorvastatin (LIPITOR) tablet 40 mg  40 mg Oral QPM Costella, Vincent J, PA-C   40 mg at 09/16/17 2131  . bisacodyl (DULCOLAX) suppository 10 mg  10 mg Rectal Daily PRN Costella, Vista Mink, PA-C      . calcium-vitamin D (OSCAL WITH D) 500-200 MG-UNIT per tablet 1 tablet  1 tablet Oral Q breakfast Consuella Lose, MD   1 tablet at 09/17/17 0811  . cyclobenzaprine (FLEXERIL) tablet 10 mg  10 mg Oral TID PRN Costella, Vista Mink, PA-C      . docusate sodium (COLACE) capsule 100 mg  100 mg Oral Daily Costella, Vincent J, PA-C   100 mg at 09/17/17 6269  . escitalopram (LEXAPRO) tablet 20 mg  20 mg Oral Daily Costella, Vista Mink, PA-C   20 mg at 09/17/17 4854  . fluticasone (FLONASE) 50 MCG/ACT nasal spray 1 spray  1 spray Each  Nare Daily PRN Costella, Vista Mink, PA-C      . lisinopril (PRINIVIL,ZESTRIL) tablet 10 mg  10 mg Oral Daily Consuella Lose, MD   10 mg at 09/17/17 1146   And  . hydrochlorothiazide (MICROZIDE) capsule 12.5 mg  12.5 mg Oral Daily Consuella Lose, MD   12.5 mg at 09/17/17 1148  . HYDROcodone-acetaminophen (NORCO/VICODIN) 5-325 MG per tablet 1-2 tablet  1-2 tablet Oral Q4H PRN Traci Sermon, PA-C   2 tablet at 09/17/17 1527  . HYDROmorphone (DILAUDID) injection 1 mg  1 mg Intravenous Q2H PRN Consuella Lose, MD      . magnesium citrate solution 1 Bottle  1 Bottle Oral Once PRN Costella, Vista Mink, PA-C      .  metoprolol succinate (TOPROL-XL) 24 hr tablet 25 mg  25 mg Oral Daily Costella, Vincent J, PA-C   25 mg at 09/17/17 1144  . ondansetron (ZOFRAN) tablet 4 mg  4 mg Oral Q6H PRN Costella, Vista Mink, PA-C       Or  . ondansetron (ZOFRAN) injection 4 mg  4 mg Intravenous Q6H PRN Costella, Vista Mink, PA-C      . oxyCODONE (Oxy IR/ROXICODONE) immediate release tablet 5-10 mg  5-10 mg Oral Q3H PRN Costella, Vincent J, PA-C   10 mg at 09/17/17 0811  . pantoprazole (PROTONIX) EC tablet 80 mg  80 mg Oral Q1200 Costella, Vista Mink, PA-C   80 mg at 09/17/17 1143  . senna (SENOKOT) tablet 8.6 mg  1 tablet Oral BID Costella, Vista Mink, PA-C   8.6 mg at 09/17/17 0815  . senna-docusate (Senokot-S) tablet 1 tablet  1 tablet Oral QHS PRN Traci Sermon, PA-C   1 tablet at 09/17/17 0427  . sodium chloride flush (NS) 0.9 % injection 3 mL  3 mL Intravenous Q12H Costella, Vincent J, PA-C   3 mL at 09/16/17 2133  . sodium chloride flush (NS) 0.9 % injection 3 mL  3 mL Intravenous Q12H Costella, Vincent J, PA-C   3 mL at 09/16/17 2133  . sodium chloride flush (NS) 0.9 % injection 3 mL  3 mL Intravenous PRN Costella, Vista Mink, PA-C      . torsemide (DEMADEX) tablet 20 mg  20 mg Oral Daily PRN Costella, Vista Mink, PA-C      . zolpidem (AMBIEN) tablet 5 mg  5 mg Oral QHS PRN Costella, Vista Mink, PA-C         Discharge Medications: Please see discharge summary for a list of discharge medications.  Relevant Imaging Results:  Relevant Lab Results:   Additional Information SS#[id number] [place], [place]

## 2017-09-17 NOTE — Progress Notes (Signed)
Neurosurgery Progress Note  No issues overnight. Pain much improved Denies any new symptoms Continues to deny cough, dyspnea, chest pain  EXAM:  BP (!) 123/53   Pulse 77   Temp 99 F (37.2 C) (Oral)   Resp 18   Ht 4\' 9"  (1.448 m)   Wt 113.4 kg (250 lb)   SpO2 100%   BMI 54.10 kg/m   Awake, alert, oriented  Speech fluent, appropriate  CN grossly intact  MAEW with good strength Incision c/d/i, surrounding bruising O2 on RA 100%  IMPRESSION Much improved this am. Will work with therapy today. O2 - much improved today. ?reponse to narcotics yesterday as CTA normal. Will continue to monitor closely.

## 2017-09-17 NOTE — Evaluation (Signed)
Physical Therapy Evaluation Patient Details Name: Michele Mcgee MRN: [id number] DOB: [birth year] Today's Date: 09/17/2017   History of Present Illness  patient is a 72 y.o. female who presented to [facility] ED earlier today due to uncontrolled back pain. She is S/P L2-3 and L3-4 decompression with instrumentation by Dr Arnoldo Morale on 09/14/2017. She had an unremarkable post operative course and was d/c home yesterday, 09/15/17 in stable condition. Unfortunately, when she got home her pain increased in severity.  Pain was so severe, she presented back to ED  Clinical Impression  Orders received for PT evaluation. Patient demonstrates deficits in functional mobility as indicated below. Will benefit from continued skilled PT to address deficits and maximize function. Will see as indicated and progress as tolerated.  Given difficulty of caregiver assist and patients current limitations, feel patient will benefit from post acute rehabilitation.    Follow Up Recommendations SNF;Supervision/Assistance - 24 hour    Equipment Recommendations  None recommended by PT    Recommendations for Other Services       Precautions / Restrictions Precautions Precautions: Fall;Back Precaution Comments: VC to adhere to precautions Required Braces or Orthoses: Spinal Brace Spinal Brace: Lumbar corset;Applied in sitting position      Mobility  Bed Mobility               General bed mobility comments: received at EOB upon entering room  Transfers Overall transfer level: Needs assistance Equipment used: Rolling walker (2 wheeled) Transfers: Sit to/from Stand Sit to Stand: Min assist;+2 physical assistance         General transfer comment: Bilateral Min assist to power up to standing from elevated bed height, VCs for hand placement and positioning. Increased time and effort to perform. Also perform from [facility] over toilet with continued cues for hand placement and safety  Ambulation/Gait Ambulation/Gait  assistance: Min assist Ambulation Distance (Feet): 60 Feet Assistive device: Rolling walker (2 wheeled) Gait Pattern/deviations: Step-through pattern;Decreased stride length;Trunk flexed Gait velocity: Decreased Gait velocity interpretation: <1.8 ft/sec, indicate of risk for recurrent falls General Gait Details: VC's for improved posture and general safety with the RW. Youth walker required due to 4'9" height. Pt ambulating generally slow but steady with BUE support. (activity on room air with desaturation to 86%)  Stairs            Wheelchair Mobility    Modified Rankin (Stroke Patients Only)       Balance Overall balance assessment: Needs assistance Sitting-balance support: Feet supported;No upper extremity supported Sitting balance-Leahy Scale: Fair     Standing balance support: Bilateral upper extremity supported Standing balance-Leahy Scale: Poor Standing balance comment: heavy reliance on RW for UE support                             Pertinent Vitals/Pain      Home Living Family/patient expects to be discharged to:: Private residence Living Arrangements: Alone Available Help at Discharge: Family;Available 24 hours/day Type of Home: House Home Access: Stairs to enter Entrance Stairs-Rails: Right;Left;Can reach both Entrance Stairs-Number of Steps: 2 Home Layout: One level Home Equipment: Walker - 2 wheels;Cane - single point;Shower seat;Grab bars - tub/shower      Prior Function Level of Independence: Needs assistance   Gait / Transfers Assistance Needed: initially was independent with mobility but since surgery, has required increased physical assist at home from daughter     Comments: Pt reports using cane for support prior to surgery.  Hand Dominance        Extremity/Trunk Assessment        Lower Extremity Assessment Lower Extremity Assessment: Generalized weakness    Cervical / Trunk Assessment Cervical / Trunk  Assessment: Other exceptions Cervical / Trunk Exceptions: back precautions  Communication      Cognition Arousal/Alertness: Awake/alert Behavior During Therapy: WFL for tasks assessed/performed Overall Cognitive Status: No family/caregiver present to determine baseline cognitive functioning                                        General Comments      Exercises     Assessment/Plan    PT Assessment Patient needs continued PT services  PT Problem List Decreased strength;Decreased range of motion;Decreased activity tolerance;Decreased balance;Decreased mobility;Decreased knowledge of use of DME;Decreased safety awareness;Decreased knowledge of precautions;Pain       PT Treatment Interventions DME instruction;Gait training;Stair training;Functional mobility training;Therapeutic activities;Therapeutic exercise;Neuromuscular re-education;Patient/family education    PT Goals (Current goals can be found in the Care Plan section)  Acute Rehab PT Goals Patient Stated Goal: to get better PT Goal Formulation: With patient Time For Goal Achievement: 10/01/17 Potential to Achieve Goals: Good    Frequency Min 3X/week   Barriers to discharge Decreased caregiver support      Co-evaluation               AM-PAC PT "6 Clicks" Daily Activity  Outcome Measure Difficulty turning over in bed (including adjusting bedclothes, sheets and blankets)?: A Little Difficulty moving from lying on back to sitting on the side of the bed? : Unable Difficulty sitting down on and standing up from a chair with arms (e.g., wheelchair, bedside commode, etc,.)?: Unable Help needed moving to and from a bed to chair (including a wheelchair)?: A Little Help needed walking in hospital room?: A Little Help needed climbing 3-5 steps with a railing? : A Lot 6 Click Score: 13    End of Session Equipment Utilized During Treatment: Gait belt;Back brace Activity Tolerance: Patient limited by  fatigue Patient left: with call bell/phone within reach;in chair Nurse Communication: Mobility status PT Visit Diagnosis: Unsteadiness on feet (R26.81);Pain;Other symptoms and signs involving the nervous system (R29.898) Pain - part of body: (back)    Time: 4917-9150 PT Time Calculation (min) (ACUTE ONLY): 17 min   Charges:   PT Evaluation $PT Eval Moderate Complexity: 1 Mod     PT G Codes:        Alben Deeds, PT DPT  Board Certified Neurologic Specialist [place] 09/17/2017, 10:33 AM

## 2017-09-17 NOTE — Evaluation (Signed)
Occupational Therapy Evaluation Patient Details Name: Michele Mcgee MRN: [id number] DOB: 08-07-1945 Today's Date: 09/17/2017    History of Present Illness Patient is a 72 y.o. female who presented to [facility] ED due to uncontrolled back pain. She is S/P L2-3 and L3-4 decompression with instrumentation by Dr Arnoldo Morale on 09/14/2017. She had an unremarkable post operative course and was d/c home 09/15/17 in stable condition. Unfortunately, when she got home her pain increased in severity.  PMH significant for HTN, CAD.   Clinical Impression   PTA, pt had just discharged home after lumbar decompression as stated above. She was independent with cane prior to surgery but has required assistance for both ambulation and all ADL since discharge home. Pt currently requiring min-mod assist +2 for toilet transfers, max assist for LB ADL, max assist for toileting hygiene. She presents with back pain and decreased activity tolerance for ADL throughout session impacting her ability to participate in ADL at [facility]. Pt on 3L O2 on arrival with SpO2 98-100%. Removed with desaturation noted to 85% during ADL participation. Returned 3L O2 with quick rebound to 96%. Pt is significantly limited in her ability to participate in ADL and functional mobility. She would benefit from continued OT services while admitted and recommend short-term SNF level rehabilitation post-acute D/C as she reports her daughter now has limited ability to assist due to injuring her own back assisting her mother. OT will continue to follow while admitted.    Follow Up Recommendations  SNF;Supervision/Assistance - 24 hour    Equipment Recommendations  Other (comment)(defer to next venue of care)    Recommendations for Other Services       Precautions / Restrictions Precautions Precautions: Fall;Back Precaution Booklet Issued: No Precaution Comments: Verbally reviewed BLT back precautions.  Required Braces or Orthoses: Spinal Brace(per pt brace is  at home) Spinal Brace: Lumbar corset;Applied in sitting position Restrictions Weight Bearing Restrictions: No      Mobility Bed Mobility               General bed mobility comments: Seated at EOB on my arrival.   Transfers Overall transfer level: Needs assistance Equipment used: Rolling walker (2 wheeled) Transfers: Sit to/from Stand Sit to Stand: +2 physical assistance;Mod assist         General transfer comment: Mod assist +2 from bed and min assist +2 from [facility].     Balance Overall balance assessment: Needs assistance Sitting-balance support: Feet supported;No upper extremity supported Sitting balance-Leahy Scale: Fair     Standing balance support: Bilateral upper extremity supported Standing balance-Leahy Scale: Poor Standing balance comment: heavy reliance on RW for UE support                           ADL either performed or assessed with clinical judgement   ADL Overall ADL's : Needs assistance/impaired Eating/Feeding: Set up;Sitting   Grooming: Set up;Sitting   Upper Body Bathing: Sitting;Supervision/ safety   Lower Body Bathing: Maximal assistance;Sit to/from stand   Upper Body Dressing : Supervision/safety;Sitting   Lower Body Dressing: Maximal assistance;Sit to/from stand   Toilet Transfer: +2 for safety/equipment;Ambulation;RW;Moderate assistance;BSC(BSC over toilet) Toilet Transfer Details (indicate cue type and reason): Assist to power up. Heavy reliance on RW. Cues for back precautions and posture.  Toileting- Clothing Manipulation and Hygiene: Maximal assistance;Sit to/from stand Toileting - Clothing Manipulation Details (indicate cue type and reason): unable to complete pericare with back precautions.      Functional mobility  during ADLs: +2 for physical assistance;Rolling walker;Moderate assistance General ADL Comments: Pt educated concerning back precautions related to ADL. She requires significant hands on assistance for all  ADL tasks. Discussed recommendation for SNF level rehabilitation and pt verbalizes agreement.      Vision Patient Visual Report: No change from baseline(per chart, glasses at all times) Vision Assessment?: No apparent visual deficits     Perception     Praxis      Pertinent Vitals/Pain Pain Assessment: 0-10 Pain Score: 8  Pain Location: Low back at incision Pain Descriptors / Indicators: Discomfort;Operative site guarding;Sore     Hand Dominance     Extremity/Trunk Assessment Upper Extremity Assessment Upper Extremity Assessment: Generalized weakness   Lower Extremity Assessment Lower Extremity Assessment: Generalized weakness   Cervical / Trunk Assessment Cervical / Trunk Assessment: Other exceptions Cervical / Trunk Exceptions: back precautions   Communication Communication Communication: No difficulties   Cognition Arousal/Alertness: Awake/alert Behavior During Therapy: WFL for tasks assessed/performed Overall Cognitive Status: No family/caregiver present to determine baseline cognitive functioning                                     General Comments  Pt now significantly limited in ADL participation.     Exercises     Shoulder Instructions      Home Living Family/patient expects to be discharged to:: Private residence Living Arrangements: Alone(daughter has been staying with her) Available Help at Discharge: Family;Available 24 hours/day Type of Home: House Home Access: Stairs to enter [employer] of Steps: 2 Entrance Stairs-Rails: Right;Left;Can reach both Home Layout: One level     Bathroom Shower/Tub: Teacher, early years/pre: Standard Bathroom Accessibility: Yes   Home Equipment: Environmental consultant - 2 wheels;Cane - single point;Shower seat;Grab bars - tub/shower          Prior Functioning/Environment Level of Independence: Needs assistance  Gait / Transfers Assistance Needed: Pt initially was independent with cane  for mobility but since surgery, has required increased physical assist at home from daughter ADL's / Homemaking Assistance Needed: Pt was independent with ADL prior to surgery but requiring assistance for all ADL from daughter post-op.    Comments: Pt reports using cane for support prior to surgery.         OT Problem List: Decreased strength;Decreased range of motion;Decreased activity tolerance;Impaired balance (sitting and/or standing);Decreased safety awareness;Decreased knowledge of use of DME or AE;Decreased knowledge of precautions;Pain      OT Treatment/Interventions: Self-care/ADL training;Therapeutic exercise;Energy conservation;DME and/or AE instruction;Therapeutic activities;Patient/family education;Balance training    OT Goals(Current goals can be found in the care plan section) Acute Rehab OT Goals Patient Stated Goal: to get better OT Goal Formulation: With patient/family Time For Goal Achievement: 10/01/17 Potential to Achieve Goals: Good  OT Frequency: Min 2X/week   Barriers to D/C:            Co-evaluation PT/OT/SLP Co-Evaluation/Treatment: Yes(overlap at end of session due to increased assist) Reason for Co-Treatment: For patient/therapist safety;To address functional/ADL transfers   OT goals addressed during session: ADL's and self-care      AM-PAC PT "6 Clicks" Daily Activity     Outcome Measure Help from another person eating meals?: None Help from another person taking care of personal grooming?: A Little Help from another person toileting, which includes using toliet, bedpan, or urinal?: A Lot Help from another person bathing (including washing, rinsing, drying)?: A Lot  Help from another person to put on and taking off regular upper body clothing?: A Little Help from another person to put on and taking off regular lower body clothing?: A Lot 6 Click Score: 16   End of Session Equipment Utilized During Treatment: Rolling walker;Gait belt(brace not  present) Nurse Communication: Mobility status  Activity Tolerance: Patient tolerated treatment well Patient left: in chair;with call bell/phone within reach  OT Visit Diagnosis: Unsteadiness on feet (R26.81);Pain Pain - part of body: (lumbar region)                Time: 2248-2500 OT Time Calculation (min): 39 min Charges:  OT General Charges $OT Visit: 1 Visit OT Evaluation $OT Eval Moderate Complexity: 1 Mod OT Treatments $Self Care/Home Management : 8-22 mins G-Codes:     Norman Herrlich, MS OTR/L  Pager: [place] A Iyani Dresner 09/17/2017, 1:20 PM

## 2017-09-18 DIAGNOSIS — G8918 Other acute postprocedural pain: Secondary | ICD-10-CM | POA: Diagnosis not present

## 2017-09-18 NOTE — Clinical Social Work Note (Signed)
Clinical Social Work Assessment  Patient Details  Name: Michele Mcgee MRN: [id number] Date of Birth: 01/15/1946  Date of referral:  09/18/17               Reason for consult:  Facility Placement                Permission sought to share information with:  Facility Contact Representative, Family Supports Permission granted to share information::  Yes, Verbal Permission Granted  Name::     Rhonda  Agency::  SNF  Relationship::  Daughter  Contact Information:     Housing/Transportation Living arrangements for the past 2 months:  Single Family Home Source of Information:  Patient, Adult Children Patient Interpreter Needed:  None Criminal Activity/Legal Involvement Pertinent to Current Situation/Hospitalization:  No - Comment as needed Significant Relationships:  Adult Children Lives with:  Self Do you feel safe going back to the place where you live?  Yes Need for family participation in patient care:  Yes (Comment)  Care giving concerns:  Patient from home alone, had increased support after surgery from daughter, but will need short term rehab at discharge.   Social Worker assessment / plan:  CSW met with patient and daughter at bedside to discuss recommendation for SNF. CSW discussed how patient had been doing at home and need for additional therapy. CSW received permission to look into SNF options, with preference for [facility] or [facility].  Employment status:  Retired Insurance information:  Managed Medicare PT Recommendations:  Skilled Nursing Facility Information / Referral to community resources:  Skilled Nursing Facility  Patient/Family's Response to care:  Patient agreeable to SNF placement.  Patient/Family's Understanding of and Emotional Response to Diagnosis, Current Treatment, and Prognosis:  Patient and daughter discussed how patient will need SNF at discharge due to how weak she's gotten and how much help she needs due to the pain she's experiencing.  Patient and daughter would prefer [facility] as first choice.   Emotional Assessment Appearance:  Appears stated age Attitude/Demeanor/Rapport:  Engaged, Lethargic Affect (typically observed):  Pleasant Orientation:  Oriented to Self, Oriented to Place, Oriented to  Time, Oriented to Situation Alcohol / Substance use:  Not Applicable Psych involvement (Current and /or in the community):  No (Comment)  Discharge Needs  Concerns to be addressed:  Care Coordination Readmission within the last 30 days:  No Current discharge risk:  Physical Impairment, Lives alone Barriers to Discharge:  Continued Medical Work up, Insurance Authorization, Awaiting State Approval ([place])   Elizabeth M Paisley, LCSW 09/18/2017, 1:37 PM  

## 2017-09-18 NOTE — Progress Notes (Signed)
Attempted to ambulate patient, refused. RN educated pt on need to ambulate, verbalized understanding.

## 2017-09-18 NOTE — Progress Notes (Signed)
Neurosurgery Progress Note  No issues overnight.  Pain continues to improve Still feels unsteady on feet  EXAM:  BP (!) 116/58 (BP Location: Left Arm)   Pulse 75   Temp 99.4 F (37.4 C) (Oral)   Resp 17   Ht 4\' 9"  (1.448 m)   Wt 113.4 kg (250 lb)   SpO2 98%   BMI 54.10 kg/m   Awake, alert, oriented  Speech fluent, appropriate  MAEW with good strength Incision c/d/i, bruising surrounding incision  PLAN Stable this am Worked with PT/OT yesterday, rec SNF. Agree with this plan Will continue to work on ambulation today Otherwise, continue current care Dr Arnoldo Morale to return tomorrow

## 2017-09-19 DIAGNOSIS — G8918 Other acute postprocedural pain: Secondary | ICD-10-CM | POA: Diagnosis not present

## 2017-09-19 LAB — GLUCOSE, CAPILLARY: Glucose-Capillary: 113 mg/dL — ABNORMAL HIGH (ref 65–99)

## 2017-09-19 MED ORDER — OXYCODONE HCL 5 MG PO TABS: 5.0000 mg | ORAL_TABLET | ORAL | 0 refills | Status: DC | PRN

## 2017-09-19 NOTE — Discharge Summary (Signed)
Physician Discharge Summary  Patient ID: Michele Mcgee MRN: [id number] DOB/AGE: [birth year] 72 y.o.  Admit date: 09/16/2017 Discharge date: 09/19/2017  Admission Diagnoses: Lumbar spondylolisthesis, lumbar spinal stenosis, lumbago, lumbar radiculopathy neurogenic claudication  Discharge Diagnoses: The same Active Problems:   Post-operative pain   Discharged Condition: good  Hospital Course: I performed an L2-3 and L3-4 decompression, instrumentation and fusion on patient on 09/14/2017.  The patient was discharged on 09/15/2017 at her request.  She had worsening pain once discharged and presented to the ER on 09/16/2017 and was readmitted for pain control.  The patient's hospital course was unremarkable.  Physical therapy has worked with her.  Arrangements were made for her to be transferred to [facility] skilled nursing facility.  She was given written and oral discharge instructions.  All her questions were answered.  Consults: Physical therapy Significant Diagnostic Studies: None Treatments: Observation Discharge Exam: Blood pressure (!) 114/46, pulse 85, temperature 98.8 F (37.1 C), temperature source Oral, resp. rate 18, height 4\' 9"  (1.448 m), weight 113.4 kg (250 lb), SpO2 98 %. The patient is alert and pleasant.  Her strength is grossly normal in her lower extremities.  Her wound is healing well.  There is some ecchymosis.  Disposition: [facility]  Discharge Instructions    Call MD for:  difficulty breathing, headache or visual disturbances   Complete by:  As directed    Call MD for:  extreme fatigue   Complete by:  As directed    Call MD for:  hives   Complete by:  As directed    Call MD for:  persistant dizziness or light-headedness   Complete by:  As directed    Call MD for:  persistant nausea and vomiting   Complete by:  As directed    Call MD for:  redness, tenderness, or signs of infection (pain, swelling, redness, odor or green/yellow  discharge around incision site)   Complete by:  As directed    Call MD for:  severe uncontrolled pain   Complete by:  As directed    Call MD for:  temperature >100.4   Complete by:  As directed    Diet - low sodium heart healthy   Complete by:  As directed    Discharge instructions   Complete by:  As directed    Call [phone] for a followup appointment. Take a stool softener while you are using pain medications.   Driving Restrictions   Complete by:  As directed    Do not drive for 2 weeks.   Increase activity slowly   Complete by:  As directed    Lifting restrictions   Complete by:  As directed    Do not lift more than 5 pounds. No excessive bending or twisting.   May shower / Bathe   Complete by:  As directed    Remove the dressing for 3 days after surgery.  You may shower, but leave the incision alone.   No dressing needed   Complete by:  As directed      Allergies as of 09/19/2017      Reactions   Ampicillin Rash   Has patient had a PCN reaction causing immediate rash, facial/tongue/throat swelling, SOB or lightheadedness with hypotension: Rash Has patient had a PCN reaction causing severe rash involving mucus membranes or skin necrosis: No Has patient had a PCN reaction that required hospitalization: No Has patient had a PCN reaction occurring within the last 10 years: No  If all of the above answers are "NO", then may proceed with Cephalosporin use.      Medication List    TAKE these medications   acetaminophen 650 MG CR tablet Commonly known as:  TYLENOL Take 650 mg by mouth 2 (two) times daily.   ALPRAZolam 0.25 MG tablet Commonly known as:  XANAX Take 0.25 mg by mouth daily as needed for anxiety.   atorvastatin 40 MG tablet Commonly known as:  LIPITOR Take 40 mg by mouth every evening.   CALCIUM 600+D3 PO Take 1 tablet by mouth 2 (two) times daily.   CENTRUM SILVER 50+WOMEN Tabs Take 1 tablet by mouth daily.   cyclobenzaprine 10 MG tablet Commonly  known as:  FLEXERIL Take 1 tablet (10 mg total) by mouth 3 (three) times daily as needed for muscle spasms.   docusate sodium 100 MG capsule Commonly known as:  COLACE Take 100 mg by mouth daily.   escitalopram 20 MG tablet Commonly known as:  LEXAPRO Take 20 mg by mouth daily.   esomeprazole 40 MG capsule Commonly known as:  NEXIUM Take 40 mg by mouth daily before breakfast. 30 minutes to 1 hour prior to breakfast   fluticasone 50 MCG/ACT nasal spray Commonly known as:  FLONASE Place 1 spray into both nostrils daily as needed for allergies.   lisinopril-hydrochlorothiazide 10-12.5 MG tablet Commonly known as:  PRINZIDE,ZESTORETIC Take 1 tablet by mouth daily.   metoprolol succinate 25 MG 24 hr tablet Commonly known as:  TOPROL-XL Take 25 mg by mouth daily.   oxyCODONE 5 MG immediate release tablet Commonly known as:  Oxy IR/ROXICODONE Take 1-2 tablets (5-10 mg total) by mouth every 4 (four) hours as needed for moderate pain or breakthrough pain. What changed:    how much to take  reasons to take this   PROVENTIL HFA 108 (90 Base) MCG/ACT inhaler Generic drug:  albuterol Inhale 2 puffs into the lungs every 6 (six) hours as needed for wheezing or shortness of breath.   ST JOSEPH ASPIRIN 81 MG EC tablet Generic drug:  aspirin Take 81 mg by mouth daily. Swallow whole.   torsemide 20 MG tablet Commonly known as:  DEMADEX Take 20 mg by mouth daily as needed (for swelling in feet).      Contact information for after-discharge care    [facility] .   Service:  Skilled Nursing Contact information: [address] [place] [phone]              Signed: Ophelia Charter 09/19/2017, 11:33 AM

## 2017-09-19 NOTE — Progress Notes (Signed)
Physical Therapy Treatment Patient Details Name: Michele Mcgee MRN: [id number] DOB: 01/09/1946 Today's Date: 09/19/2017    History of Present Illness Patient is a 72 y.o. female who presented to [facility] ED due to uncontrolled back pain. She is S/P L2-3 and L3-4 decompression with instrumentation by Dr Arnoldo Morale on 09/14/2017. She had an unremarkable post operative course and was d/c home 09/15/17 in stable condition. Unfortunately, when she got home her pain increased in severity.  PMH significant for HTN, CAD.    PT Comments    Progressing slowly with mobility. Tolerated increased distance but continues to require cues for compliance with precautions.    Follow Up Recommendations  SNF;Supervision/Assistance - 24 hour     Equipment Recommendations  None recommended by PT    Recommendations for Other Services       Precautions / Restrictions Precautions Precautions: Fall;Back Precaution Booklet Issued: No Precaution Comments: Verbally reviewed BLT back precautions.  Required Braces or Orthoses: Spinal Brace(per pt brace is at home) Spinal Brace: Lumbar corset;Applied in sitting position Restrictions Weight Bearing Restrictions: No    Mobility  Bed Mobility               General bed mobility comments: sitting in recliner  Transfers Overall transfer level: Needs assistance Equipment used: Rolling walker (2 wheeled) Transfers: Sit to/from Stand Sit to Stand: Min assist         General transfer comment: Min assist for stability  Ambulation/Gait Ambulation/Gait assistance: Min guard Ambulation Distance (Feet): 90 Feet Assistive device: Rolling walker (2 wheeled) Gait Pattern/deviations: Step-through pattern;Decreased stride length;Trunk flexed Gait velocity: Decreased   General Gait Details: VCs for speed and positioning   Stairs             Wheelchair Mobility    Modified Rankin (Stroke Patients Only)       Balance Overall balance assessment: Needs  assistance Sitting-balance support: Feet supported;No upper extremity supported Sitting balance-Leahy Scale: Fair     Standing balance support: Bilateral upper extremity supported Standing balance-Leahy Scale: Poor Standing balance comment: heavy reliance on RW for UE support                            Cognition Arousal/Alertness: Awake/alert Behavior During Therapy: WFL for tasks assessed/performed Overall Cognitive Status: No family/caregiver present to determine baseline cognitive functioning                                 General Comments: slow processing;requiring multiple repetitions of instructions      Exercises      General Comments        Pertinent Vitals/Pain Pain Assessment: Faces Faces Pain Scale: Hurts little more Pain Location: Low back at incision Pain Descriptors / Indicators: Discomfort;Operative site guarding;Sore Pain Intervention(s): Monitored during session    Home Living                      Prior Function            PT Goals (current goals can now be found in the care plan section) Acute Rehab PT Goals Patient Stated Goal: to get better PT Goal Formulation: With patient Time For Goal Achievement: 10/01/17 Potential to Achieve Goals: Good Progress towards PT goals: Progressing toward goals    Frequency    Min 3X/week      PT Plan Current plan remains  appropriate    Co-evaluation              AM-PAC PT "6 Clicks" Daily Activity  Outcome Measure  Difficulty turning over in bed (including adjusting bedclothes, sheets and blankets)?: A Little Difficulty moving from lying on back to sitting on the side of the bed? : Unable Difficulty sitting down on and standing up from a chair with arms (e.g., wheelchair, bedside commode, etc,.)?: Unable Help needed moving to and from a bed to chair (including a wheelchair)?: A Little Help needed walking in hospital room?: A Little Help needed climbing 3-5  steps with a railing? : A Lot 6 Click Score: 13    End of Session Equipment Utilized During Treatment: Gait belt;Back brace Activity Tolerance: Patient limited by fatigue Patient left: with call bell/phone within reach;in chair Nurse Communication: Mobility status PT Visit Diagnosis: Unsteadiness on feet (R26.81);Pain;Other symptoms and signs involving the nervous system (R29.898) Pain - part of body: (back)     Time: 4287-6811 PT Time Calculation (min) (ACUTE ONLY): 24 min  Charges:  $Gait Training: 8-22 mins $Therapeutic Activity: 8-22 mins                    G Codes:       Alben Deeds, PT DPT  Board Certified Neurologic Specialist [place] 09/19/2017, 1:01 PM

## 2017-09-19 NOTE — Care Management Note (Signed)
Case Management Note  Patient Details  Name: Michele Mcgee MRN: [id number] Date of Birth: [birth year]  Subjective/Objective:                    Action/Plan: Pt is discharging to [facility] today. CM signing off.   Expected Discharge Date:                  Expected Discharge Plan:  Skilled Nursing Facility  In-House Referral:  Clinical Social Work  Discharge planning Services     Post Acute Care Choice:    Choice offered to:     DME Arranged:    DME Agency:     HH Arranged:    [place] Agency:     Status of Service:  Completed, signed off  If discussed at [employer], dates discussed:    Additional Comments:  Pollie Friar, RN 09/19/2017, 11:28 AM

## 2017-09-19 NOTE — Clinical Social Work Placement (Signed)
Nurse to call report to [phone], [facility] [place]  NOTE  Date:  09/19/2017  Patient Details  Name: Michele Mcgee MRN: [id number] Date of Birth: February 10, 1946  Clinical Social Work is seeking post-discharge placement for this patient at the [place] level of care (*CSW will initial, date and re-position this form in  chart as items are completed):  Yes   Patient/family provided with [place] Work Department's list of facilities offering this level of care within the geographic area requested by the patient (or if unable, by the patient's family).  Yes   Patient/family informed of their freedom to choose among providers that offer the needed level of care, that participate in Medicare, Medicaid or managed care program needed by the patient, have an available bed and are willing to accept the patient.  Yes   Patient/family informed of [facility]'s ownership interest in [facility] and [facility], as well as of the fact that they are under no obligation to receive care at these facilities.  PASRR submitted to EDS on       PASRR number received on       Existing PASRR number confirmed on       FL2 transmitted to all facilities in geographic area requested by pt/family on       FL2 transmitted to all facilities within larger geographic area on       Patient informed that his/her managed care company has contracts with or will negotiate with certain facilities, including the following:        Yes   Patient/family informed of bed offers received.  Patient chooses bed at [facility]     Physician recommends and patient chooses bed at      Patient to be transferred to [facility] on 09/19/17.  Patient to be transferred to facility by [facility]     Patient family notified on 09/19/17 of transfer.  Name of family member notified:  Rhonda     PHYSICIAN       Additional Comment:     _______________________________________________ Geralynn Ochs, LCSW 09/19/2017, 2:10 PM

## 2017-09-19 NOTE — Final Progress Note (Signed)
NURSING PROGRESS NOTE  Michele Mcgee [id number] Discharge Data: 09/19/2017 3:10 PM Attending Provider: Newman Pies, MD [id number], Ocie Cornfield, MD     Izetta Dakin to be D/C'd [place] per MD order.  Discussed with the patient the After Visit Summary and all questions fully answered. All IV's discontinued with no bleeding noted. All belongings returned to patient for patient to take home. Report called and given to [facility], LPN at [facility].  Last Vital Signs:  Blood pressure (!) 129/55, pulse 79, temperature 98.7 F (37.1 C), temperature source Oral, resp. rate 18, height 4\' 9"  (1.448 m), weight 113.4 kg (250 lb), SpO2 97 %.  Discharge Medication List Allergies as of 09/19/2017      Reactions   Ampicillin Rash   Has patient had a PCN reaction causing immediate rash, facial/tongue/throat swelling, SOB or lightheadedness with hypotension: Rash Has patient had a PCN reaction causing severe rash involving mucus membranes or skin necrosis: No Has patient had a PCN reaction that required hospitalization: No Has patient had a PCN reaction occurring within the last 10 years: No If all of the above answers are "NO", then may proceed with Cephalosporin use.      Medication List    TAKE these medications   acetaminophen 650 MG CR tablet Commonly known as:  TYLENOL Take 650 mg by mouth 2 (two) times daily.   ALPRAZolam 0.25 MG tablet Commonly known as:  XANAX Take 0.25 mg by mouth daily as needed for anxiety.   atorvastatin 40 MG tablet Commonly known as:  LIPITOR Take 40 mg by mouth every evening.   CALCIUM 600+D3 PO Take 1 tablet by mouth 2 (two) times daily.   CENTRUM SILVER 50+WOMEN Tabs Take 1 tablet by mouth daily.   cyclobenzaprine 10 MG tablet Commonly known as:  FLEXERIL Take 1 tablet (10 mg total) by mouth 3 (three) times daily as needed for muscle spasms.   docusate sodium 100 MG capsule Commonly known as:  COLACE Take  100 mg by mouth daily.   escitalopram 20 MG tablet Commonly known as:  LEXAPRO Take 20 mg by mouth daily.   esomeprazole 40 MG capsule Commonly known as:  NEXIUM Take 40 mg by mouth daily before breakfast. 30 minutes to 1 hour prior to breakfast   fluticasone 50 MCG/ACT nasal spray Commonly known as:  FLONASE Place 1 spray into both nostrils daily as needed for allergies.   lisinopril-hydrochlorothiazide 10-12.5 MG tablet Commonly known as:  PRINZIDE,ZESTORETIC Take 1 tablet by mouth daily.   metoprolol succinate 25 MG 24 hr tablet Commonly known as:  TOPROL-XL Take 25 mg by mouth daily.   oxyCODONE 5 MG immediate release tablet Commonly known as:  Oxy IR/ROXICODONE Take 1-2 tablets (5-10 mg total) by mouth every 4 (four) hours as needed for moderate pain or breakthrough pain. What changed:    how much to take  reasons to take this   PROVENTIL HFA 108 (90 Base) MCG/ACT inhaler Generic drug:  albuterol Inhale 2 puffs into the lungs every 6 (six) hours as needed for wheezing or shortness of breath.   ST JOSEPH ASPIRIN 81 MG EC tablet Generic drug:  aspirin Take 81 mg by mouth daily. Swallow whole.   torsemide 20 MG tablet Commonly known as:  DEMADEX Take 20 mg by mouth daily as needed (for swelling in feet).

## 2017-09-19 NOTE — Care Management Important Message (Signed)
Important Message  Patient Details  Name: Michele Mcgee MRN: [id number] Date of Birth: 11/09/1945   Medicare Important Message Given:  Yes    Orbie Pyo 09/19/2017, 3:46 PM

## 2017-10-04 ENCOUNTER — Encounter: Payer: Self-pay | Admitting: Emergency Medicine

## 2017-10-04 ENCOUNTER — Emergency Department
Admission: EM | Admit: 2017-10-04 | Discharge: 2017-10-04 | Disposition: A | Discharge status: Home / Self Care | Payer: Medicare Other | Attending: Emergency Medicine | Admitting: Emergency Medicine

## 2017-10-04 ENCOUNTER — Emergency Department: Payer: Medicare Other

## 2017-10-04 DIAGNOSIS — W19XXXA Unspecified fall, initial encounter: Secondary | ICD-10-CM | POA: Diagnosis not present

## 2017-10-04 DIAGNOSIS — I259 Chronic ischemic heart disease, unspecified: Secondary | ICD-10-CM | POA: Diagnosis not present

## 2017-10-04 DIAGNOSIS — Z7982 Long term (current) use of aspirin: Secondary | ICD-10-CM | POA: Insufficient documentation

## 2017-10-04 DIAGNOSIS — M25552 Pain in left hip: Secondary | ICD-10-CM | POA: Diagnosis present

## 2017-10-04 DIAGNOSIS — I10 Essential (primary) hypertension: Secondary | ICD-10-CM | POA: Diagnosis not present

## 2017-10-04 DIAGNOSIS — Z79899 Other long term (current) drug therapy: Secondary | ICD-10-CM | POA: Insufficient documentation

## 2017-10-04 MED ORDER — HYDROCODONE-ACETAMINOPHEN 5-325 MG PO TABS: 1.0000 | ORAL_TABLET | ORAL | 0 refills | Status: DC | PRN

## 2017-10-04 MED ORDER — HYDROCODONE-ACETAMINOPHEN 5-325 MG PO TABS
1.0000 | ORAL_TABLET | Freq: Once | ORAL | Status: AC
  Administered 2017-10-04: 1 via ORAL
  Filled 2017-10-04: qty 1

## 2017-10-04 NOTE — ED Triage Notes (Signed)
Pt in via EMS from [facility]. EMS reports pt at [facility] for rehab after back surgery. EMS reports pt had a fall Sunday, witnessed by daughter and today she continues to have pain to her left hip and leg.

## 2017-10-04 NOTE — ED Provider Notes (Signed)
[facility] Emergency Department Provider Note  Time seen: 8:39 AM  I have reviewed the triage vital signs and the nursing notes.   HISTORY  Chief Complaint Hip Pain and Leg Pain    HPI Michele Mcgee is a 72 y.o. female with a past medical history of anxiety, arthritis, CAD, gastric reflux, hypertension, hyperlipidemia who presents to the emergency department with left hip pain.  According to the patient she is currently at [facility] with back issues.  States on Sunday she had a fall falling onto her left side.  Has had pain in the left hip ever since.  Had x-rays performed at her facility that are negative per patient.  Continues to have pain in the left hip so was sent to the emergency department for further evaluation.  Patient denies any other injuries.  Denies hitting head or loss of consciousness.  Negative review of systems.  Describes her pain is moderate dull pain, worse with attempted ambulation or movement.   Past Medical History:  Diagnosis Date  . Anxiety   . Arthritis   . Choledocholithiasis   . Coronary artery disease   . Female bladder prolapse   . GERD (gastroesophageal reflux disease)   . Hypercholesterolemia   . Hypertension   . PONV (postoperative nausea and vomiting)   . Spondylolisthesis of lumbar region     Patient Active Problem List   Diagnosis Date Noted  . Post-operative pain 09/16/2017  . Spondylolisthesis of lumbar region 09/14/2017    Past Surgical History:  Procedure Laterality Date  . ABDOMINAL HYSTERECTOMY    . BACK SURGERY    . Bladder Tacking Surgery    . COLONOSCOPY    . COLONOSCOPY WITH PROPOFOL N/A 12/09/2014   Procedure: COLONOSCOPY WITH PROPOFOL;  Surgeon: Manya Silvas, MD;  Location: [facility] ENDOSCOPY;  Service: Endoscopy;  Laterality: N/A;  . ERCP    . ESOPHAGEAL DILATION    . ESOPHAGOGASTRODUODENOSCOPY    . LAMINECTOMY    . TUBAL LIGATION    . VEIN LIGATION AND STRIPPING      Prior to  Admission medications   Medication Sig Start Date End Date Taking? Authorizing Provider  acetaminophen (TYLENOL) 650 MG CR tablet Take 650 mg by mouth 2 (two) times daily.    [provider]  albuterol (PROVENTIL HFA) 108 (90 Base) MCG/ACT inhaler Inhale 2 puffs into the lungs every 6 (six) hours as needed for wheezing or shortness of breath.  08/08/17 08/08/18  [provider]  ALPRAZolam Duanne Moron) 0.25 MG tablet Take 0.25 mg by mouth daily as needed for anxiety.     [provider]  aspirin (ST JOSEPH ASPIRIN) 81 MG EC tablet Take 81 mg by mouth daily. Swallow whole.     [provider]  atorvastatin (LIPITOR) 40 MG tablet Take 40 mg by mouth every evening.    [provider]  Calcium Carb-Cholecalciferol (CALCIUM 600+D3 PO) Take 1 tablet by mouth 2 (two) times daily.    [provider]  cyclobenzaprine (FLEXERIL) 10 MG tablet Take 1 tablet (10 mg total) by mouth 3 (three) times daily as needed for muscle spasms. 09/15/17   Newman Pies, MD  docusate sodium (COLACE) 100 MG capsule Take 100 mg by mouth daily.    [provider]  escitalopram (LEXAPRO) 20 MG tablet Take 20 mg by mouth daily.    [provider]  esomeprazole (NEXIUM) 40 MG capsule Take 40 mg by mouth daily before breakfast. 30  minutes to 1 hour prior to breakfast    [provider]  fluticasone (FLONASE) 50 MCG/ACT nasal spray Place 1 spray into both nostrils daily as needed for allergies.    [provider]  lisinopril-hydrochlorothiazide (PRINZIDE,ZESTORETIC) 10-12.5 MG per tablet Take 1 tablet by mouth daily.    [provider]  metoprolol succinate (TOPROL-XL) 25 MG 24 hr tablet Take 25 mg by mouth daily.    [provider]  Multiple Vitamins-Minerals (CENTRUM SILVER 50+WOMEN) TABS Take 1 tablet by mouth daily.    [provider]  oxyCODONE (OXY IR/ROXICODONE) 5 MG immediate release tablet Take 1-2 tablets (5-10 mg  total) by mouth every 4 (four) hours as needed for moderate pain or breakthrough pain. 09/19/17   Newman Pies, MD  torsemide (DEMADEX) 20 MG tablet Take 20 mg by mouth daily as needed (for swelling in feet).    [provider]    Allergies  Allergen Reactions  . Ampicillin Rash    Has patient had a PCN reaction causing immediate rash, facial/tongue/throat swelling, SOB or lightheadedness with hypotension: Rash Has patient had a PCN reaction causing severe rash involving mucus membranes or skin necrosis: No Has patient had a PCN reaction that required hospitalization: No Has patient had a PCN reaction occurring within the last 10 years: No If all of the above answers are "NO", then may proceed with Cephalosporin use.     Family History  Problem Relation Age of Onset  . Breast cancer Neg Hx     Social History Social History   Tobacco Use  . Smoking status: Never Smoker  . Smokeless tobacco: Never Used  Substance Use Topics  . Alcohol use: Never    Frequency: Never  . Drug use: Never    Review of Systems Constitutional: Negative for fever. Eyes: Negative for visual complaints ENT: Negative for recent illness/congestion Cardiovascular: Negative for chest pain. Respiratory: Negative for shortness of breath. Gastrointestinal: Negative for abdominal pain Genitourinary: Negative for urinary compaints Musculoskeletal: Moderate left hip pain Skin: Negative for skin complaints  Neurological: Negative for headache All other ROS negative  ____________________________________________   PHYSICAL EXAM:  VITAL SIGNS: ED Triage Vitals  Enc Vitals Group     BP 10/04/17 0831 (!) 154/59     Pulse Rate 10/04/17 0831 80     Resp 10/04/17 0831 18     Temp 10/04/17 0831 98.5 F (36.9 C)     Temp Source 10/04/17 0831 Oral     SpO2 10/04/17 0831 93 %     Weight 10/04/17 0832 246 lb (111.6 kg)     Height 10/04/17 0832 4\' 9"  (1.448 m)     Head Circumference --      Peak  Flow --      Pain Score 10/04/17 0832 9     Pain Loc --      Pain Edu? --      Excl. in [place]? --     Constitutional: Alert and oriented. Well appearing and in no distress. Eyes: Normal exam ENT   Head: Normocephalic and atraumatic.   Mouth/Throat: Mucous membranes are moist. Cardiovascular: Normal rate, regular rhythm.  Respiratory: Normal respiratory effort without tachypnea nor retractions. Breath sounds are clear  Gastrointestinal: Soft and nontender.  Obese. Musculoskeletal: Moderate left hip tenderness to palpation with mild pain to range of motion.  Neurovascular intact distally.  2+ DP pulse.  Leg is otherwise nontender. Neurologic:  Normal speech and language. No gross focal neurologic deficits  Skin:  Skin is warm, dry and intact.  Psychiatric: Mood and affect are normal.   ____________________________________________   RADIOLOGY  CT scan negative for acute abnormality  ____________________________________________   INITIAL IMPRESSION / ASSESSMENT AND PLAN / ED COURSE  Pertinent labs & imaging results that were available during my care of the patient were reviewed by me and considered in my medical decision making (see chart for details).  Patient presents to the emergency department with left hip pain after fall 2 days ago.  Had x-rays performed at the facility that were negative per patient.  Continues to have pain with tenderness and pain with range of motion.  We will obtain a CT scan of the hip to further evaluate.  Patient agreeable to this plan of care.  CT scan negative for fracture.  Highly suspect muscular skeletal injury versus nerve injury.  We will discharge the patient with PCP follow-up, short course of pain medication.  ____________________________________________   FINAL CLINICAL IMPRESSION(S) / ED DIAGNOSES  Left hip pain Cheral Marker, MD 10/04/17 [phone]

## 2017-10-04 NOTE — ED Notes (Signed)
Patient complaining of increasing pain to left leg and hip after falling Sunday. Patient had witnessed mechanical fall at [facility]. Preliminary xray negative for fracture. States today she is unable to bear weight at all due to pain.

## 2017-10-04 NOTE — ED Notes (Signed)
Attempted to call report to [facility]. Was placed on hold repeatedly. Will give discharge instructions to EMS and patient's daughter.

## 2017-10-04 NOTE — ED Notes (Signed)
[facility]  CALLED  FOR  TRANSPORT 

## 2017-10-05 ENCOUNTER — Emergency Department: Payer: Medicare Other

## 2017-10-05 ENCOUNTER — Emergency Department
Admission: EM | Admit: 2017-10-05 | Discharge: 2017-10-05 | Disposition: A | Discharge status: Other Acute Inpatient Hospital | Payer: Medicare Other | Attending: Emergency Medicine | Admitting: Emergency Medicine

## 2017-10-05 ENCOUNTER — Inpatient Hospital Stay (HOSPITAL_COMMUNITY)
Admission: AD | Admit: 2017-10-05 | Discharge: 2017-10-14 | DRG: 460 | Disposition: A | Discharge status: Skilled Nursing Facility | Payer: Medicare Other | Source: Other Acute Inpatient Hospital | Attending: Neurosurgery | Admitting: Neurosurgery

## 2017-10-05 ENCOUNTER — Encounter (HOSPITAL_COMMUNITY): Payer: Self-pay | Admitting: Internal Medicine

## 2017-10-05 ENCOUNTER — Other Ambulatory Visit: Payer: Self-pay

## 2017-10-05 DIAGNOSIS — Z885 Allergy status to narcotic agent status: Secondary | ICD-10-CM

## 2017-10-05 DIAGNOSIS — Y92230 Patient room in hospital as the place of occurrence of the external cause: Secondary | ICD-10-CM | POA: Diagnosis not present

## 2017-10-05 DIAGNOSIS — M4846XA Fatigue fracture of vertebra, lumbar region, initial encounter for fracture: Secondary | ICD-10-CM | POA: Diagnosis present

## 2017-10-05 DIAGNOSIS — F32A Depression, unspecified: Secondary | ICD-10-CM | POA: Diagnosis present

## 2017-10-05 DIAGNOSIS — M5416 Radiculopathy, lumbar region: Secondary | ICD-10-CM | POA: Diagnosis not present

## 2017-10-05 DIAGNOSIS — Z7982 Long term (current) use of aspirin: Secondary | ICD-10-CM | POA: Insufficient documentation

## 2017-10-05 DIAGNOSIS — S32049A Unspecified fracture of fourth lumbar vertebra, initial encounter for closed fracture: Secondary | ICD-10-CM | POA: Diagnosis present

## 2017-10-05 DIAGNOSIS — E785 Hyperlipidemia, unspecified: Secondary | ICD-10-CM | POA: Diagnosis not present

## 2017-10-05 DIAGNOSIS — E7800 Pure hypercholesterolemia, unspecified: Secondary | ICD-10-CM

## 2017-10-05 DIAGNOSIS — M25552 Pain in left hip: Secondary | ICD-10-CM

## 2017-10-05 DIAGNOSIS — F329 Major depressive disorder, single episode, unspecified: Secondary | ICD-10-CM | POA: Diagnosis not present

## 2017-10-05 DIAGNOSIS — E860 Dehydration: Secondary | ICD-10-CM | POA: Diagnosis not present

## 2017-10-05 DIAGNOSIS — I10 Essential (primary) hypertension: Secondary | ICD-10-CM | POA: Diagnosis present

## 2017-10-05 DIAGNOSIS — W19XXXS Unspecified fall, sequela: Secondary | ICD-10-CM | POA: Diagnosis not present

## 2017-10-05 DIAGNOSIS — Y9289 Other specified places as the place of occurrence of the external cause: Secondary | ICD-10-CM

## 2017-10-05 DIAGNOSIS — M5442 Lumbago with sciatica, left side: Secondary | ICD-10-CM | POA: Insufficient documentation

## 2017-10-05 DIAGNOSIS — W19XXXA Unspecified fall, initial encounter: Secondary | ICD-10-CM | POA: Diagnosis present

## 2017-10-05 DIAGNOSIS — Z79899 Other long term (current) drug therapy: Secondary | ICD-10-CM

## 2017-10-05 DIAGNOSIS — S72012A Unspecified intracapsular fracture of left femur, initial encounter for closed fracture: Secondary | ICD-10-CM | POA: Diagnosis not present

## 2017-10-05 DIAGNOSIS — N390 Urinary tract infection, site not specified: Secondary | ICD-10-CM | POA: Diagnosis not present

## 2017-10-05 DIAGNOSIS — F419 Anxiety disorder, unspecified: Secondary | ICD-10-CM | POA: Diagnosis not present

## 2017-10-05 DIAGNOSIS — S76312A Strain of muscle, fascia and tendon of the posterior muscle group at thigh level, left thigh, initial encounter: Secondary | ICD-10-CM | POA: Diagnosis not present

## 2017-10-05 DIAGNOSIS — S76812A Strain of other specified muscles, fascia and tendons at thigh level, left thigh, initial encounter: Secondary | ICD-10-CM | POA: Diagnosis present

## 2017-10-05 DIAGNOSIS — Z6841 Body Mass Index (BMI) 40.0 and over, adult: Secondary | ICD-10-CM

## 2017-10-05 DIAGNOSIS — M79605 Pain in left leg: Secondary | ICD-10-CM | POA: Diagnosis present

## 2017-10-05 DIAGNOSIS — F411 Generalized anxiety disorder: Secondary | ICD-10-CM | POA: Diagnosis not present

## 2017-10-05 DIAGNOSIS — Z419 Encounter for procedure for purposes other than remedying health state, unspecified: Secondary | ICD-10-CM

## 2017-10-05 DIAGNOSIS — T84226A Displacement of internal fixation device of vertebrae, initial encounter: Secondary | ICD-10-CM | POA: Diagnosis present

## 2017-10-05 DIAGNOSIS — S76012A Strain of muscle, fascia and tendon of left hip, initial encounter: Secondary | ICD-10-CM | POA: Diagnosis present

## 2017-10-05 DIAGNOSIS — K08409 Partial loss of teeth, unspecified cause, unspecified class: Secondary | ICD-10-CM | POA: Diagnosis present

## 2017-10-05 DIAGNOSIS — M1990 Unspecified osteoarthritis, unspecified site: Secondary | ICD-10-CM | POA: Diagnosis not present

## 2017-10-05 DIAGNOSIS — M5430 Sciatica, unspecified side: Secondary | ICD-10-CM | POA: Diagnosis not present

## 2017-10-05 DIAGNOSIS — I2510 Atherosclerotic heart disease of native coronary artery without angina pectoris: Secondary | ICD-10-CM | POA: Diagnosis present

## 2017-10-05 DIAGNOSIS — T83511A Infection and inflammatory reaction due to indwelling urethral catheter, initial encounter: Secondary | ICD-10-CM | POA: Diagnosis not present

## 2017-10-05 DIAGNOSIS — Z9851 Tubal ligation status: Secondary | ICD-10-CM

## 2017-10-05 DIAGNOSIS — Z7951 Long term (current) use of inhaled steroids: Secondary | ICD-10-CM

## 2017-10-05 DIAGNOSIS — M960 Pseudarthrosis after fusion or arthrodesis: Secondary | ICD-10-CM | POA: Diagnosis present

## 2017-10-05 DIAGNOSIS — Z881 Allergy status to other antibiotic agents status: Secondary | ICD-10-CM | POA: Diagnosis not present

## 2017-10-05 DIAGNOSIS — B961 Klebsiella pneumoniae [K. pneumoniae] as the cause of diseases classified elsewhere: Secondary | ICD-10-CM | POA: Diagnosis not present

## 2017-10-05 DIAGNOSIS — T40605A Adverse effect of unspecified narcotics, initial encounter: Secondary | ICD-10-CM | POA: Diagnosis not present

## 2017-10-05 DIAGNOSIS — K219 Gastro-esophageal reflux disease without esophagitis: Secondary | ICD-10-CM | POA: Diagnosis present

## 2017-10-05 DIAGNOSIS — Z90710 Acquired absence of both cervix and uterus: Secondary | ICD-10-CM

## 2017-10-05 DIAGNOSIS — K5903 Drug induced constipation: Secondary | ICD-10-CM | POA: Diagnosis not present

## 2017-10-05 DIAGNOSIS — M79604 Pain in right leg: Secondary | ICD-10-CM | POA: Diagnosis present

## 2017-10-05 LAB — BASIC METABOLIC PANEL
Anion gap: 12 (ref 5–15)
BUN: 15 mg/dL (ref 8–23)
CHLORIDE: 98 mmol/L (ref 98–111)
CO2: 28 mmol/L (ref 22–32)
Calcium: 9 mg/dL (ref 8.9–10.3)
Creatinine, Ser: 0.67 mg/dL (ref 0.44–1.00)
GFR calc Af Amer: >60 mL/min (ref >60)
GFR calc non Af Amer: >60 mL/min (ref >60)
Glucose, Bld: 133 mg/dL — ABNORMAL HIGH (ref 70–99)
POTASSIUM: 3.9 mmol/L (ref 3.5–5.1)
Sodium: 138 mmol/L (ref 135–145)

## 2017-10-05 LAB — CBC
HEMATOCRIT: 35.7 % (ref 35.0–47.0)
HEMOGLOBIN: 12.1 g/dL (ref 12.0–16.0)
MCH: 31.1 pg (ref 26.0–34.0)
MCHC: 34 g/dL (ref 32.0–36.0)
MCV: 91.4 fL (ref 80.0–100.0)
Platelets: 270 10*3/uL (ref 150–440)
RBC: 3.9 MIL/uL (ref 3.80–5.20)
RDW: 13.9 % (ref 11.5–14.5)
WBC: 7.3 10*3/uL (ref 3.6–11.0)

## 2017-10-05 MED ORDER — MORPHINE SULFATE (PF) 2 MG/ML IV SOLN
2.0000 mg | Freq: Once | INTRAVENOUS | Status: AC
  Administered 2017-10-05: 2 mg via INTRAVENOUS
  Filled 2017-10-05: qty 1

## 2017-10-05 MED ORDER — MORPHINE SULFATE (PF) 4 MG/ML IV SOLN
4.0000 mg | Freq: Once | INTRAVENOUS | Status: AC
  Administered 2017-10-05: 4 mg via INTRAVENOUS
  Filled 2017-10-05: qty 1

## 2017-10-05 MED ORDER — METHOCARBAMOL 500 MG PO TABS
500.0000 mg | ORAL_TABLET | Freq: Three times a day (TID) | ORAL | Status: DC | PRN
  Administered 2017-10-06 – 2017-10-07 (×5): 500 mg via ORAL
  Filled 2017-10-05 (×5): qty 1

## 2017-10-05 MED ORDER — METHYLPREDNISOLONE SODIUM SUCC 125 MG IJ SOLR
125.0000 mg | INTRAMUSCULAR | Status: AC
  Administered 2017-10-05: 125 mg via INTRAVENOUS
  Filled 2017-10-05: qty 2

## 2017-10-05 MED ORDER — MORPHINE SULFATE (PF) 2 MG/ML IV SOLN
2.0000 mg | INTRAVENOUS | Status: DC | PRN
  Administered 2017-10-06: 2 mg via INTRAVENOUS
  Filled 2017-10-05: qty 1

## 2017-10-05 MED ORDER — OXYCODONE-ACETAMINOPHEN 5-325 MG PO TABS: 1.0000 | ORAL_TABLET | ORAL | Status: DC | PRN

## 2017-10-05 NOTE — Care Management (Addendum)
  This is a no charge note  Transfer from [facility] per Dr. Delman Kitten  72 year old lady with past medical history of hypertension, hyperlipidemia, GERD, depression, anxiety, CAD, who presents with fall and left leg pain.  Pt was recently hospitalized from 6/7-6/10 and had surgery by Dr. Arnoldo Morale for lumbar spondylolisthesis, lumbar spinal stenosis, lumbago, lumbar radiculopathy neurogenic claudication. She is doing rehab currently. She had fall on Sunday, and developed pain in left leg. She was seen on 6/25 and had negative CT-left hip, but continues to have pain.   CT-L spin today showed: 1. L3-L4 PLIF with marked lucency surrounding both L4 transpedicular screws, with irregularity of the left lateral vertebral body cortex. With recent surgery and no available prior lumbar spine CT, the acuity of the findings is uncertain, but given the patient's recent fall, this is concerning for hardware loosening and fracture of the lateral vertebral body cortex. 2. No spinal canal stenosis. 3. Mild bilateral L5-S1 neural foraminal narrowing. 4.  Aortic Atherosclerosis (ICD10-I70.0).  X-ray of Left femur showed: Suspicious lucency at the base of the left femoral head raising concern for possible nondisplaced subcapital left femoral neck fracture.   EDP consulted neurosurgeon, Dr. Rita Ohara of [facility] hospital, who recommended to transfer patient to [facility] hospital. Dr. Arnoldo Morale will see pt in AM.   Patient was found to have WBC 7.3, electrolytes renal function okay, temperature normal, no tachycardia, oxygen saturation 94% on room air, blood pressure 158/67.  Patient is admitted to [place] bed as inpatient.   Please call manager of [facility] at [phone] when pt arrives to floor   Ivor Costa, MD  [facility] Pager [phone]  If 7PM-7AM, please contact night-coverage [email or web address] Password [email or web address] 10/05/2017, 9:20 PM

## 2017-10-05 NOTE — ED Triage Notes (Signed)
Pt arrived via [facility] from [facility]. Per EMS report they were called out for right hip pain, pt had left back surgery a few weeks ago, pt had a fall on Sunday and she was placed on Norco and Gabapentin recently, was hypertensive with them and was given Norco today at 1400. Pt reports that she fell on Sunday and hit her back and that she is having left hip pain at 10/10. Pt appears to be very anxious and is screaming about the pain. Pt does not show any signs of respiratory distress, she denies chest pain, blurred vision, trouble with voiding or bowel movements. Pt is able to move all toes. Call bell is within reach. We will continue to monitor the pt.

## 2017-10-05 NOTE — ED Notes (Signed)
Pt to CT with Tech

## 2017-10-05 NOTE — ED Notes (Signed)
External Cath placed on pt.

## 2017-10-05 NOTE — ED Notes (Signed)
Report given to [place]

## 2017-10-05 NOTE — ED Provider Notes (Addendum)
[facility] Emergency Department Provider Note   ____________________________________________   First MD Initiated Contact with Patient 10/05/17 1755     (approximate)  I have reviewed the triage vital signs and the nursing notes.   HISTORY  Chief Complaint Hip Pain    HPI Michele Mcgee is a 72 y.o. female history of coronary disease, hypertension, recent lumbar surgery  Patient reports that a couple of days ago while doing physical therapy it felt like her leg gave out, she fell onto her buttock region is been having pain over the left lower back that radiates out shooting sharp feeling towards the side of her left leg.  She was seen here for it yesterday, and reports that the pain is continued to worsen and is now about a "30" out of 10 on the pain scale and sharp shooting and severe in nature.  No fevers or chills.  No nausea or vomiting.  Not associated abdominal pain.  No changes in bowel or bladder habits.  Denies numbness or weakness in the feet or legs except the left leg hurts too bad to move.  There is pain located in the left lower back and she is concerned about her surgery site.     Past Medical History:  Diagnosis Date  . Anxiety   . Arthritis   . Choledocholithiasis   . Coronary artery disease   . Female bladder prolapse   . GERD (gastroesophageal reflux disease)   . Hypercholesterolemia   . Hypertension   . PONV (postoperative nausea and vomiting)   . Spondylolisthesis of lumbar region     Patient Active Problem List   Diagnosis Date Noted  . Post-operative pain 09/16/2017  . Spondylolisthesis of lumbar region 09/14/2017    Past Surgical History:  Procedure Laterality Date  . ABDOMINAL HYSTERECTOMY    . BACK SURGERY    . Bladder Tacking Surgery    . COLONOSCOPY    . COLONOSCOPY WITH PROPOFOL N/A 12/09/2014   Procedure: COLONOSCOPY WITH PROPOFOL;  Surgeon: Manya Silvas, MD;  Location: [facility] ENDOSCOPY;  Service:  Endoscopy;  Laterality: N/A;  . ERCP    . ESOPHAGEAL DILATION    . ESOPHAGOGASTRODUODENOSCOPY    . LAMINECTOMY    . TUBAL LIGATION    . VEIN LIGATION AND STRIPPING      Prior to Admission medications   Medication Sig Start Date End Date Taking? Authorizing Provider  acetaminophen (TYLENOL) 325 MG tablet Take 650 mg by mouth every 4 (four) hours as needed for mild pain or moderate pain.   Yes [provider]  acetaminophen (TYLENOL) 650 MG CR tablet Take 650 mg by mouth 2 (two) times daily.   Yes [provider]  albuterol (PROVENTIL HFA) 108 (90 Base) MCG/ACT inhaler Inhale 2 puffs into the lungs every 6 (six) hours as needed for wheezing or shortness of breath.  08/08/17 08/08/18 Yes [provider]  ALPRAZolam (XANAX) 0.25 MG tablet Take 0.25 mg by mouth daily as needed for anxiety.    Yes [provider]  aspirin (ST JOSEPH ASPIRIN) 81 MG EC tablet Take 81 mg by mouth daily. Swallow whole.    Yes [provider]  atorvastatin (LIPITOR) 40 MG tablet Take 40 mg by mouth every evening.   Yes [provider]  Calcium Carbonate-Vitamin D3 (CALCIUM 600+D3) 600-400 MG-UNIT TABS Take 1 tablet by mouth 2 (two) times daily.    Yes [provider]  cyclobenzaprine (FLEXERIL) 10 MG tablet Take  1 tablet (10 mg total) by mouth 3 (three) times daily as needed for muscle spasms. 09/15/17  Yes Newman Pies, MD  escitalopram (LEXAPRO) 20 MG tablet Take 20 mg by mouth daily.   Yes [provider]  esomeprazole (NEXIUM) 40 MG capsule Take 40 mg by mouth daily before breakfast. 30 minutes to 1 hour prior to breakfast   Yes [provider]  fluticasone (FLONASE) 50 MCG/ACT nasal spray Place 1 spray into both nostrils daily as needed for allergies.   Yes [provider]  HYDROcodone-acetaminophen (NORCO/VICODIN) 5-325 MG tablet Take 1 tablet by mouth every 4 (four) hours as needed. 10/04/17  Yes Harvest Dark, MD    lisinopril-hydrochlorothiazide (PRINZIDE,ZESTORETIC) 10-12.5 MG per tablet Take 1 tablet by mouth daily.   Yes [provider]  metoprolol succinate (TOPROL-XL) 25 MG 24 hr tablet Take 25 mg by mouth daily.   Yes [provider]  Multiple Vitamins-Minerals (CENTRUM SILVER 50+WOMEN) TABS Take 1 tablet by mouth daily.   Yes [provider]  senna-docusate (PERI-COLACE) 8.6-50 MG tablet Take 2 tablets by mouth 2 (two) times daily.   Yes [provider]  torsemide (DEMADEX) 20 MG tablet Take 20 mg by mouth daily as needed (for swelling in feet).   Yes [provider]  oxyCODONE (OXY IR/ROXICODONE) 5 MG immediate release tablet Take 1-2 tablets (5-10 mg total) by mouth every 4 (four) hours as needed for moderate pain or breakthrough pain. Patient not taking: Reported on 10/05/2017 09/19/17   Newman Pies, MD    Allergies Ampicillin  Family History  Problem Relation Age of Onset  . Breast cancer Neg Hx     Social History Social History   Tobacco Use  . Smoking status: Never Smoker  . Smokeless tobacco: Never Used  Substance Use Topics  . Alcohol use: Never    Frequency: Never  . Drug use: Never    Review of Systems Constitutional: No fever/chills Eyes: No visual changes. ENT: No sore throat. Cardiovascular: Denies chest pain. Respiratory: Denies shortness of breath. Gastrointestinal: No abdominal pain.  No nausea, no vomiting.  No diarrhea.  No constipation. Genitourinary: Negative for dysuria. Musculoskeletal: Negative for back pain. Skin: Negative for rash. Neurological: Negative for headaches, focal weakness or numbness.    ____________________________________________   PHYSICAL EXAM:  VITAL SIGNS: ED Triage Vitals  Enc Vitals Group     BP 10/05/17 1745 (!) 158/74     Pulse Rate 10/05/17 1748 85     Resp 10/05/17 1758 (!) 22     Temp 10/05/17 1758 98.6 F (37 C)     Temp Source 10/05/17 1758 Oral     SpO2 10/05/17  1748 97 %     Weight 10/05/17 1759 246 lb (111.6 kg)     Height 10/05/17 1759 4\' 9"  (1.448 m)     Head Circumference --      Peak Flow --      Pain Score 10/05/17 1758 10     Pain Loc --      Pain Edu? --      Excl. in [place]? --     Constitutional: Alert and oriented. Well appearing and in no acute distress. Eyes: Conjunctivae are normal. Head: Atraumatic. Nose: No congestion/rhinnorhea. Mouth/Throat: Mucous membranes are moist. Neck: No stridor.   Cardiovascular: Normal rate, regular rhythm. Grossly normal heart sounds.  Good peripheral circulation. Respiratory: Normal respiratory effort.  No retractions. Lungs CTAB. Gastrointestinal: Soft and nontender. No distention. Musculoskeletal: No lower extremity tenderness  nor edema, patient logrolled, and on exam her lower midline lumbar incision appears clean dry and intact.  She reports notable pain primarily over the left lower paraspinous region, and also pain across the left lateral hip joint.  There is no bruising.  Patient does not have any shortening or rotation involving left hip.  Strong dorsalis pedis pulses and normal capillary refill in both lower extremities bilateral.  Able to wiggle her toes well and discriminate sensation in both feet well, but cannot range the left leg due to pain that she reports rating from her left lower back and hip region down the leg with attempts to move it. Neurologic:  Normal speech and language. No gross focal neurologic deficits are appreciated.  Skin:  Skin is warm, dry and intact. No rash noted. Psychiatric: Mood and affect are normal. Speech and behavior are normal.  ____________________________________________   LABS (all labs ordered are listed, but only abnormal results are displayed)  Labs Reviewed  BASIC METABOLIC PANEL - Abnormal; Notable for the following components:      Result Value   Glucose, Bld 133 (*)    All other components within normal limits  CBC    ____________________________________________  EKG   ____________________________________________  RADIOLOGY  Ct Lumbar Spine Wo Contrast  Result Date: 10/05/2017 CLINICAL DATA:  Recent surgery, followed by a more recent fall. Hip pain and back pain. EXAM: CT LUMBAR SPINE WITHOUT CONTRAST TECHNIQUE: Multidetector CT imaging of the lumbar spine was performed without intravenous contrast administration. Multiplanar CT image reconstructions were also generated. COMPARISON:  Lumbar spine radiograph 09/16/2017, 08/19/2017 Lumbar spine MRI 05/20/2017 FINDINGS: Segmentation: There is transitional lumbosacral anatomy. Numbering is preserved from the prior studies, with the fused levels considered to be L3-L4. Alignment: Normal alignment. Vertebrae: Old superior endplate fracture of L3 is unchanged. There is PLIF hardware at L3-L4. There is lucency surrounding both L4 screws with irregularity of the lateral vertebral body cortex (series 4, image 101) on the left. Paraspinal and other soft tissues: There is calcific aortic atherosclerosis. Disc levels: There is no stenosis or large disc herniation of the disc levels above L2. At L2-L3, there is no spinal canal stenosis or neural foraminal stenosis. There is mild disc space narrowing and facet hypertrophy. No residual spinal canal stenosis at L4-L5. Neural foramina are patent. At L5-S1, there is mild bilateral foraminal narrowing. IMPRESSION: 1. L3-L4 PLIF with marked lucency surrounding both L4 transpedicular screws, with irregularity of the left lateral vertebral body cortex. With recent surgery and no available prior lumbar spine CT, the acuity of the findings is uncertain, but given the patient's recent fall, this is concerning for hardware loosening and fracture of the lateral vertebral body cortex. 2. No spinal canal stenosis. 3. Mild bilateral L5-S1 neural foraminal narrowing. 4.  Aortic Atherosclerosis (ICD10-I70.0). Electronically Signed   By: Ulyses Jarred M.D.   On: 10/05/2017 19:22   Dg Femur Min 2 Views Left  Result Date: 10/05/2017 CLINICAL DATA:  Patient fell Sunday and now has left hip pain. EXAM: LEFT FEMUR 2 VIEWS COMPARISON:  None. FINDINGS: Suspicious subcapital left femoral neck lucency raises concern for a nondisplaced fracture. CT of the left hip is recommended for better assessment. Osteoarthritic spurring of the left acetabular roof is noted. The adjacent left pubic rami appear intact. No acute abnormality of the adjacent left SI joint. The left femoral shaft and femoral condyles are unremarkable. There is mild joint space narrowing of the included knee. No joint effusion is visualized. IMPRESSION:  Suspicious lucency at the base of the left femoral head raising concern for possible nondisplaced subcapital left femoral neck fracture. CT is therefore recommended for better correlation. Electronically Signed   By: Ashley Royalty M.D.   On: 10/05/2017 19:37     CT and left hip x-rays reviewed by me.  Please CT CT scan report as dictated by radiologist.  Left femur x-ray suspicious for possible lucency left femoral head. ____________________________________________   PROCEDURES  Procedure(s) performed: None  Procedures  Critical Care performed: No  ____________________________________________   INITIAL IMPRESSION / ASSESSMENT AND PLAN / ED COURSE  Pertinent labs & imaging results that were available during my care of the patient were reviewed by me and considered in my medical decision making (see chart for details).  Patient comes for evaluation of increasing pain over the left lower back and left hip region with recent lumbar surgery at [facility].  She also reports an interim fall a couple of days ago, but has not fallen since her last ER visit and reports increasing pain in the same region.  Clinical history with her lumbar surgery, fall are suspicious for possible injury or hardware displacement now with a radicular type  of pain, however also hip fracture is considered but she did have a CT scan yesterday did not demonstrate any acute abnormality.  Clinical Course as of Oct 06 2122  Wed Oct 05, 2017  1829 Discussed case, Dr. Durene Cal at [facility], recommend CT lumbar spine to evaluate for injury or hardware failure/dispalcement after fall. Does not recommend MRi at this time as CT will give better information on post surgical hardware.    [MQ]  1957 Dr. Durene Cal advises transfer to [facility] hospitalists for further evaluation, pain control,    [MQ]  2109 Ongoing care assigned to Dr. Mariea Clonts, patient awaiting transfer to [facility] for further evaluation of low back and left hip pain.   [MQ]    Clinical Course User Index [MQ] Delman Kitten, MD   Patient accepted by Dr. Blaine Hamper at [facility], we discussed patient's pain and a plan to obtain MRI of the left hip to exclude fracture as well as the need for neurosurgical consultation which Dr. Lise Auer advises Dr. Arnoldo Morale can perform tomorrow at [facility].  Patient Dr. daughter agreeable with plan for transfer to [facility].  ----------------------------------------- 9:07 PM on 10/05/2017 -----------------------------------------  Patient seen, reassessment completed.  Patient comfortable, resting in no distress.  [facility] anticipated to arrive shortly, patient appears stable for ALS level transport at this time.  ____________________________________________   FINAL CLINICAL IMPRESSION(S) / ED DIAGNOSES  Final diagnoses:  Acute left-sided low back pain with left-sided sciatica  Left hip pain      NEW MEDICATIONS STARTED DURING THIS VISIT:  New Prescriptions   No medications on file     Note:  This document was prepared using Dragon voice recognition software and may include unintentional dictation errors.     Delman Kitten, MD 10/05/17 2108    Delman Kitten, MD 10/05/17 2124

## 2017-10-05 NOTE — ED Notes (Signed)
Attempted 2x to call report to [facility] at [facility].

## 2017-10-05 NOTE — H&P (Signed)
History and Physical    Michele Mcgee YQM:[id number] DOB: 03/14/1946 DOA: 10/05/2017  Referring MD/NP/PA:   PCP: Kirk Ruths, MD   Patient coming from:  The patient is coming from Rehab.  At baseline, pt is independent for most of ADL.  Chief Complaint: fall and left leg pain  HPI: Michele Mcgee is a 72 y.o. female with medical history significant of hypertension, hyperlipidemia, GERD, depression, anxiety, CAD, who presents with fall and left leg pain.  Pt was recently hospitalized from 6/7-6/10 and had surgery by Dr. Arnoldo Morale for lumbar spondylolisthesis, lumbar spinal stenosis, lumbago, lumbar radiculopathy neurogenic claudication. She is doing rehab currently. She had fall on Sunday, and developed pain in left leg. She states that her daughter caught her head, no head injury. Her leg pain involves left upper thigh down to foot, mainly in lateral side. The pain is constant, sharp, severe, 10 out of 10 severity.  It is aggravated by movement.  She states that she has numbness in the left leg sometimes. No loss of control of bladder or bowel movement.  Patient denies any chest pain, shortness breath, cough, nausea, vomiting, diarrhea, abdominal pain, symptoms of UTI. She was seen on 6/25 and had negative CT-left hip, but continues to have pain. Pt was evaluated again in ED of [facility] today. She had X-ray of Left femur, which showed suspicious lucency at the base of the left femoral head raising concern for possible nondisplaced subcapital left femoral neck fracture.She also had CT-L spin today, which showed evidence concerning for hardware loosening and fracture of the lateral vertebral body cortex. EDP consulted neurosurgeon, Dr. Rita Ohara of [facility] hospital, who recommended to transfer patient to [facility] hospital. Dr. Arnoldo Morale will see pt in AM. pt states that she takes torsemide as needed for leg edema, but denies hx of CHF.  ED Course: pt was found to have WBC 7.3, electrolytes renal function  okay, temperature normal, no tachycardia, oxygen saturation 94% on room air, blood pressure 158/67.  Patient is admitted to [place] bed as inpatient.  Review of Systems:   General: no fevers, chills, no body weight gain, has fatigue HEENT: no blurry vision, hearing changes or sore throat Respiratory: no dyspnea, coughing, wheezing CV: no chest pain, no palpitations GI: no nausea, vomiting, abdominal pain, diarrhea, constipation GU: no dysuria, burning on urination, increased urinary frequency, hematuria  Ext: has trace leg edema Neuro: no unilateral weakness, numbness, or tingling, no vision change or hearing loss Skin: no rash, no skin tear. MSK: has pain in left leg Heme: No easy bruising.  Travel history: No recent long distant travel.  Allergy:  Allergies  Allergen Reactions  . Oxycodone   . Ampicillin Rash    Has patient had a PCN reaction causing immediate rash, facial/tongue/throat swelling, SOB or lightheadedness with hypotension: Rash Has patient had a PCN reaction causing severe rash involving mucus membranes or skin necrosis: No Has patient had a PCN reaction that required hospitalization: No Has patient had a PCN reaction occurring within the last 10 years: No If all of the above answers are "NO", then may proceed with Cephalosporin use.     Past Medical History:  Diagnosis Date  . Anxiety   . Arthritis   . Choledocholithiasis   . Coronary artery disease   . Female bladder prolapse   . GERD (gastroesophageal reflux disease)   . Hypercholesterolemia   . Hypertension   . PONV (postoperative nausea and vomiting)   . Spondylolisthesis of lumbar region  Past Surgical History:  Procedure Laterality Date  . ABDOMINAL HYSTERECTOMY    . BACK SURGERY    . Bladder Tacking Surgery    . COLONOSCOPY    . COLONOSCOPY WITH PROPOFOL N/A 12/09/2014   Procedure: COLONOSCOPY WITH PROPOFOL;  Surgeon: Manya Silvas, MD;  Location[facility] ENDOSCOPY;  Service: Endoscopy;   Laterality: N/A;  . ERCP    . ESOPHAGEAL DILATION    . ESOPHAGOGASTRODUODENOSCOPY    . LAMINECTOMY    . TUBAL LIGATION    . VEIN LIGATION AND STRIPPING      Social History:  reports that she has never smoked. She has never used smokeless tobacco. She reports that she does not drink alcohol or use drugs.  Family History:  Family History  Problem Relation Age of Onset  . Breast cancer Neg Hx      Prior to Admission medications   Medication Sig Start Date End Date Taking? Authorizing Provider  acetaminophen (TYLENOL) 325 MG tablet Take 650 mg by mouth every 4 (four) hours as needed for mild pain or moderate pain.    [provider]  acetaminophen (TYLENOL) 650 MG CR tablet Take 650 mg by mouth 2 (two) times daily.    [provider]  albuterol (PROVENTIL HFA) 108 (90 Base) MCG/ACT inhaler Inhale 2 puffs into the lungs every 6 (six) hours as needed for wheezing or shortness of breath.  08/08/17 08/08/18  [provider]  ALPRAZolam Duanne Moron) 0.25 MG tablet Take 0.25 mg by mouth daily as needed for anxiety.     [provider]  aspirin (ST JOSEPH ASPIRIN) 81 MG EC tablet Take 81 mg by mouth daily. Swallow whole.     [provider]  atorvastatin (LIPITOR) 40 MG tablet Take 40 mg by mouth every evening.    [provider]  Calcium Carbonate-Vitamin D3 (CALCIUM 600+D3) 600-400 MG-UNIT TABS Take 1 tablet by mouth 2 (two) times daily.     [provider]  cyclobenzaprine (FLEXERIL) 10 MG tablet Take 1 tablet (10 mg total) by mouth 3 (three) times daily as needed for muscle spasms. 09/15/17   Newman Pies, MD  escitalopram (LEXAPRO) 20 MG tablet Take 20 mg by mouth daily.    [provider]  esomeprazole (NEXIUM) 40 MG capsule Take 40 mg by mouth daily before breakfast. 30 minutes to 1 hour prior to breakfast    [provider]  fluticasone (FLONASE) 50 MCG/ACT nasal spray Place 1 spray into both nostrils daily as  needed for allergies.    [provider]  HYDROcodone-acetaminophen (NORCO/VICODIN) 5-325 MG tablet Take 1 tablet by mouth every 4 (four) hours as needed. 10/04/17   Harvest Dark, MD  lisinopril-hydrochlorothiazide (PRINZIDE,ZESTORETIC) 10-12.5 MG per tablet Take 1 tablet by mouth daily.    [provider]  metoprolol succinate (TOPROL-XL) 25 MG 24 hr tablet Take 25 mg by mouth daily.    [provider]  Multiple Vitamins-Minerals (CENTRUM SILVER 50+WOMEN) TABS Take 1 tablet by mouth daily.    [provider]  oxyCODONE (OXY IR/ROXICODONE) 5 MG immediate release tablet Take 1-2 tablets (5-10 mg total) by mouth every 4 (four) hours as needed for moderate pain or breakthrough pain. Patient not taking: Reported on 10/05/2017 09/19/17   Newman Pies, MD  senna-docusate (PERI-COLACE) 8.6-50 MG tablet Take 2 tablets by mouth 2 (two) times daily.    [provider]  torsemide (DEMADEX) 20 MG tablet Take 20 mg by mouth daily as needed (for swelling in  feet).    [provider]    Physical Exam: Vitals:   10/06/17 0018  BP: (!) 123/59  Pulse: 80  Temp: 98.2 F (36.8 C)  SpO2: 91%   General: Not in acute distress HEENT:       Eyes: PERRL, EOMI, no scleral icterus.       ENT: No discharge from the ears and nose, no pharynx injection, no tonsillar enlargement.        Neck: No JVD, no bruit, no mass felt. Heme: No neck lymph node enlargement. Cardiac: S1/S2, RRR, No murmurs, No gallops or rubs. Respiratory: No rales, wheezing, rhonchi or rubs. GI: Soft, nondistended, nontender, no rebound pain, no organomegaly, BS present. GU: No hematuria Ext: has trace leg pitting leg edema bilaterally. 2+DP/PT pulse bilaterally. Musculoskeletal: has tenderness to light touch in left leg, particularly in the lateral upper thigh Skin: No rashes.  Neuro: Alert, oriented X3, cranial nerves II-XII grossly intact, moves all extremities. Psych: Patient is  not psychotic, no suicidal or hemocidal ideation.  Labs on Admission: I have personally reviewed following labs and imaging studies  CBC: Recent Labs  Lab 10/05/17 1810 10/06/17 0030  WBC 7.3 8.1  HGB 12.1 12.1  HCT 35.7 37.4  MCV 91.4 94.0  PLT 270 056   Basic Metabolic Panel: Recent Labs  Lab 10/05/17 1810 10/06/17 0030  NA 138 137  K 3.9 4.2  CL 98 96*  CO2 28 30  GLUCOSE 133* 164*  BUN 15 13  CREATININE 0.67 0.71  CALCIUM 9.0 9.4   GFR: Estimated Creatinine Clearance: 68 mL/min (by C-G formula based on SCr of 0.71 mg/dL). Liver Function Tests: No results for input(s): AST, ALT, ALKPHOS, BILITOT, PROT, ALBUMIN in the last 168 hours. No results for input(s): LIPASE, AMYLASE in the last 168 hours. No results for input(s): AMMONIA in the last 168 hours. Coagulation Profile: Recent Labs  Lab 10/06/17 0030  INR 1.09   Cardiac Enzymes: No results for input(s): CKTOTAL, CKMB, CKMBINDEX, TROPONINI in the last 168 hours. BNP (last 3 results) No results for input(s): PROBNP in the last 8760 hours. HbA1C: No results for input(s): HGBA1C in the last 72 hours. CBG: No results for input(s): GLUCAP in the last 168 hours. Lipid Profile: No results for input(s): CHOL, HDL, LDLCALC, TRIG, CHOLHDL, LDLDIRECT in the last 72 hours. Thyroid Function Tests: No results for input(s): TSH, T4TOTAL, FREET4, T3FREE, THYROIDAB in the last 72 hours. Anemia Panel: No results for input(s): VITAMINB12, FOLATE, FERRITIN, TIBC, IRON, RETICCTPCT in the last 72 hours. Urine analysis: No results found for: COLORURINE, APPEARANCEUR, LABSPEC, PHURINE, GLUCOSEU, HGBUR, BILIRUBINUR, KETONESUR, PROTEINUR, UROBILINOGEN, NITRITE, LEUKOCYTESUR Sepsis Labs: @LABRCNTIP (procalcitonin:4,lacticidven:4) )No results found for this or any previous visit (from the past 240 hour(s)).   Radiological Exams on Admission: Ct Lumbar Spine Wo Contrast  Result Date: 10/05/2017 CLINICAL DATA:  Recent surgery,  followed by a more recent fall. Hip pain and back pain. EXAM: CT LUMBAR SPINE WITHOUT CONTRAST TECHNIQUE: Multidetector CT imaging of the lumbar spine was performed without intravenous contrast administration. Multiplanar CT image reconstructions were also generated. COMPARISON:  Lumbar spine radiograph 09/16/2017, 08/19/2017 Lumbar spine MRI 05/20/2017 FINDINGS: Segmentation: There is transitional lumbosacral anatomy. Numbering is preserved from the prior studies, with the fused levels considered to be L3-L4. Alignment: Normal alignment. Vertebrae: Old superior endplate fracture of L3 is unchanged. There is PLIF hardware at L3-L4. There is lucency surrounding both L4 screws with irregularity of the lateral vertebral body cortex (series 4, image 101)  on the left. Paraspinal and other soft tissues: There is calcific aortic atherosclerosis. Disc levels: There is no stenosis or large disc herniation of the disc levels above L2. At L2-L3, there is no spinal canal stenosis or neural foraminal stenosis. There is mild disc space narrowing and facet hypertrophy. No residual spinal canal stenosis at L4-L5. Neural foramina are patent. At L5-S1, there is mild bilateral foraminal narrowing. IMPRESSION: 1. L3-L4 PLIF with marked lucency surrounding both L4 transpedicular screws, with irregularity of the left lateral vertebral body cortex. With recent surgery and no available prior lumbar spine CT, the acuity of the findings is uncertain, but given the patient's recent fall, this is concerning for hardware loosening and fracture of the lateral vertebral body cortex. 2. No spinal canal stenosis. 3. Mild bilateral L5-S1 neural foraminal narrowing. 4.  Aortic Atherosclerosis (ICD10-I70.0). Electronically Signed   By: Ulyses Jarred M.D.   On: 10/05/2017 19:22   Ct Hip Left Wo Contrast  Result Date: 10/04/2017 CLINICAL DATA:  Left hip and leg pain since a fall 2 days ago. EXAM: CT OF THE LEFT HIP WITHOUT CONTRAST TECHNIQUE:  Multidetector CT imaging of the left hip was performed according to the standard protocol. Multiplanar CT image reconstructions were also generated. COMPARISON:  None. FINDINGS: Bones/Joint/Cartilage There is no fracture or dislocation. There are degenerative changes at the symphysis pubis. Slight marginal osteophyte formation on the left acetabulum. Muscles and Tendons Normal. Soft tissues Normal. IMPRESSION: No acute abnormality of the left hip. Electronically Signed   By: Lorriane Shire M.D.   On: 10/04/2017 09:40   Dg Femur Min 2 Views Left  Result Date: 10/05/2017 CLINICAL DATA:  Patient fell Sunday and now has left hip pain. EXAM: LEFT FEMUR 2 VIEWS COMPARISON:  None. FINDINGS: Suspicious subcapital left femoral neck lucency raises concern for a nondisplaced fracture. CT of the left hip is recommended for better assessment. Osteoarthritic spurring of the left acetabular roof is noted. The adjacent left pubic rami appear intact. No acute abnormality of the adjacent left SI joint. The left femoral shaft and femoral condyles are unremarkable. There is mild joint space narrowing of the included knee. No joint effusion is visualized. IMPRESSION: Suspicious lucency at the base of the left femoral head raising concern for possible nondisplaced subcapital left femoral neck fracture. CT is therefore recommended for better correlation. Electronically Signed   By: Ashley Royalty M.D.   On: 10/05/2017 19:37     EKG: Not done in ED, will get one.   Assessment/Plan Principal Problem:   Fall Active Problems:   Coronary artery disease   GERD (gastroesophageal reflux disease)   HLD (hyperlipidemia)   Subcapital fracture of femur, left, closed, initial encounter ([place])   Left leg pain   Depression   Essential hypertension   Fall and left leg pain: Patient has severe left whole leg pain, with numbness sometimes.  No loss control for bladder or bowel movement. She had X-ray of Left femur, which showed  suspicious lucency at the base of the left femoral head raising concern for possible nondisplaced subcapital left femoral neck fracture. CT-L spin showed evidence concerning for hardware loosening and fracture of the lateral vertebral body cortex. EDP consulted neurosurgeon, Dr. Rita Ohara of [facility] hospital, who recommended to transfer patient to [facility] hospital. Dr. Arnoldo Morale will see pt in AM.   - will admit to Med-surg bed as inpt - Pain control: morphine prn and tylenol - When necessary Zofran for nausea - Robaxin for muscle spasm - type  and cross - INR/PTT - PT/OT when able to (not ordered now) - keep pt NPO after MN in case pt needs surgery. - pt is on SQ Heparin for DVT PPx. Please d/c if pt needs surgery.  CAD: s/p of stent. No CP. -Continue aspirin, Lipitor and metoprolol  HTN:  -Continue home medications: Metoprolol, Prinzide -IV hydralazine prn  HLD: -Lipitor  GERD: -Protonix  Depression and anxiety: Stable, no suicidal or homicidal ideations. -Continue home medications: Lexapro, Xanax as needed   DVT ppx: SQ Heparin Code Status: Full code Family Communication: None at bed side.  Disposition Plan:  To be determined, possible rehab Consults called: Neurosurgeon, Dr. Rita Ohara Admission status: medical floor/inpt   Date of Service 10/06/2017    Ivor Costa [facility] Pager [phone]  If 7PM-7AM, please contact night-coverage [email or web address] Password [email or web address] 10/06/2017, 2:19 AM

## 2017-10-05 NOTE — ED Notes (Signed)
Report given to 5N at [facility] to nurse [place] and awaiting transport

## 2017-10-06 ENCOUNTER — Encounter (HOSPITAL_COMMUNITY): Payer: Self-pay | Admitting: General Practice

## 2017-10-06 DIAGNOSIS — F329 Major depressive disorder, single episode, unspecified: Secondary | ICD-10-CM | POA: Diagnosis present

## 2017-10-06 DIAGNOSIS — I10 Essential (primary) hypertension: Secondary | ICD-10-CM | POA: Diagnosis present

## 2017-10-06 DIAGNOSIS — M79605 Pain in left leg: Secondary | ICD-10-CM | POA: Diagnosis present

## 2017-10-06 DIAGNOSIS — F32A Depression, unspecified: Secondary | ICD-10-CM | POA: Diagnosis present

## 2017-10-06 DIAGNOSIS — S76312A Strain of muscle, fascia and tendon of the posterior muscle group at thigh level, left thigh, initial encounter: Secondary | ICD-10-CM | POA: Diagnosis present

## 2017-10-06 LAB — APTT: APTT: 31 s (ref 24–36)

## 2017-10-06 LAB — TYPE AND SCREEN
ABO/RH(D): A POS
ANTIBODY SCREEN: NEGATIVE

## 2017-10-06 LAB — CBC
HEMATOCRIT: 37.4 % (ref 36.0–46.0)
HEMOGLOBIN: 12.1 g/dL (ref 12.0–15.0)
MCH: 30.4 pg (ref 26.0–34.0)
MCHC: 32.4 g/dL (ref 30.0–36.0)
MCV: 94 fL (ref 78.0–100.0)
Platelets: 234 10*3/uL (ref 150–400)
RBC: 3.98 MIL/uL (ref 3.87–5.11)
RDW: 12.8 % (ref 11.5–15.5)
WBC: 8.1 10*3/uL (ref 4.0–10.5)

## 2017-10-06 LAB — BASIC METABOLIC PANEL
Anion gap: 11 (ref 5–15)
BUN: 13 mg/dL (ref 8–23)
CHLORIDE: 96 mmol/L — AB (ref 98–111)
CO2: 30 mmol/L (ref 22–32)
CREATININE: 0.71 mg/dL (ref 0.44–1.00)
Calcium: 9.4 mg/dL (ref 8.9–10.3)
GFR calc Af Amer: >60 mL/min (ref >60)
GFR calc non Af Amer: >60 mL/min (ref >60)
GLUCOSE: 164 mg/dL — AB (ref 70–99)
Potassium: 4.2 mmol/L (ref 3.5–5.1)
Sodium: 137 mmol/L (ref 135–145)

## 2017-10-06 LAB — PROTIME-INR
INR: 1.09
PROTHROMBIN TIME: 14 s (ref 11.4–15.2)

## 2017-10-06 LAB — BRAIN NATRIURETIC PEPTIDE: B Natriuretic Peptide: 31.6 pg/mL (ref 0.0–100.0)

## 2017-10-06 MED ORDER — MORPHINE SULFATE (PF) 2 MG/ML IV SOLN
2.0000 mg | INTRAVENOUS | Status: DC | PRN
  Administered 2017-10-06 – 2017-10-07 (×7): 2 mg via INTRAVENOUS
  Filled 2017-10-06 (×7): qty 1

## 2017-10-06 MED ORDER — POLYETHYLENE GLYCOL 3350 17 G PO PACK: 17.0000 g | PACK | Freq: Every day | ORAL | Status: DC | PRN

## 2017-10-06 MED ORDER — ATORVASTATIN CALCIUM 40 MG PO TABS
40.0000 mg | ORAL_TABLET | Freq: Every evening | ORAL | Status: DC
  Administered 2017-10-06 – 2017-10-13 (×8): 40 mg via ORAL
  Filled 2017-10-06 (×8): qty 1

## 2017-10-06 MED ORDER — ESCITALOPRAM OXALATE 10 MG PO TABS
20.0000 mg | ORAL_TABLET | Freq: Every day | ORAL | Status: DC
  Administered 2017-10-06 – 2017-10-14 (×9): 20 mg via ORAL
  Filled 2017-10-06 (×9): qty 2

## 2017-10-06 MED ORDER — LISINOPRIL 10 MG PO TABS
10.0000 mg | ORAL_TABLET | Freq: Every day | ORAL | Status: DC
  Administered 2017-10-06 – 2017-10-12 (×7): 10 mg via ORAL
  Filled 2017-10-06 (×9): qty 1

## 2017-10-06 MED ORDER — HEPARIN SODIUM (PORCINE) 5000 UNIT/ML IJ SOLN
5000.0000 [IU] | Freq: Three times a day (TID) | INTRAMUSCULAR | Status: DC
  Administered 2017-10-06 – 2017-10-11 (×17): 5000 [IU] via SUBCUTANEOUS
  Filled 2017-10-06 (×20): qty 1

## 2017-10-06 MED ORDER — ZOLPIDEM TARTRATE 5 MG PO TABS
5.0000 mg | ORAL_TABLET | Freq: Every evening | ORAL | Status: DC | PRN
  Administered 2017-10-08 – 2017-10-13 (×3): 5 mg via ORAL
  Filled 2017-10-06 (×3): qty 1

## 2017-10-06 MED ORDER — CALCIUM CARBONATE-VITAMIN D 500-200 MG-UNIT PO TABS
1.0000 | ORAL_TABLET | Freq: Two times a day (BID) | ORAL | Status: DC
  Administered 2017-10-06 – 2017-10-14 (×16): 1 via ORAL
  Filled 2017-10-06 (×17): qty 1

## 2017-10-06 MED ORDER — FLUTICASONE PROPIONATE 50 MCG/ACT NA SUSP
1.0000 | Freq: Every day | NASAL | Status: DC | PRN
  Filled 2017-10-06: qty 16

## 2017-10-06 MED ORDER — LISINOPRIL-HYDROCHLOROTHIAZIDE 10-12.5 MG PO TABS: 1.0000 | ORAL_TABLET | Freq: Every day | ORAL | Status: DC

## 2017-10-06 MED ORDER — METOPROLOL SUCCINATE ER 25 MG PO TB24
25.0000 mg | ORAL_TABLET | Freq: Every day | ORAL | Status: DC
  Administered 2017-10-06 – 2017-10-13 (×8): 25 mg via ORAL
  Filled 2017-10-06 (×9): qty 1

## 2017-10-06 MED ORDER — HYDRALAZINE HCL 20 MG/ML IJ SOLN: 5.0000 mg | INTRAMUSCULAR | Status: DC | PRN

## 2017-10-06 MED ORDER — ONDANSETRON HCL 4 MG/2ML IJ SOLN: 4.0000 mg | Freq: Three times a day (TID) | INTRAMUSCULAR | Status: DC | PRN

## 2017-10-06 MED ORDER — ASPIRIN 81 MG PO CHEW
81.0000 mg | CHEWABLE_TABLET | Freq: Every day | ORAL | Status: DC
  Administered 2017-10-06 – 2017-10-11 (×6): 81 mg via ORAL
  Filled 2017-10-06 (×6): qty 1

## 2017-10-06 MED ORDER — ALBUTEROL SULFATE HFA 108 (90 BASE) MCG/ACT IN AERS: 2.0000 | INHALATION_SPRAY | Freq: Four times a day (QID) | RESPIRATORY_TRACT | Status: DC | PRN

## 2017-10-06 MED ORDER — PANTOPRAZOLE SODIUM 40 MG PO TBEC
40.0000 mg | DELAYED_RELEASE_TABLET | Freq: Every day | ORAL | Status: DC
  Administered 2017-10-06 – 2017-10-11 (×6): 40 mg via ORAL
  Filled 2017-10-06 (×7): qty 1

## 2017-10-06 MED ORDER — ALPRAZOLAM 0.25 MG PO TABS
0.2500 mg | ORAL_TABLET | Freq: Every day | ORAL | Status: DC | PRN
  Administered 2017-10-06 – 2017-10-08 (×3): 0.25 mg via ORAL
  Filled 2017-10-06 (×4): qty 1

## 2017-10-06 MED ORDER — ADULT MULTIVITAMIN W/MINERALS CH
1.0000 | ORAL_TABLET | Freq: Every day | ORAL | Status: DC
  Administered 2017-10-06 – 2017-10-14 (×8): 1 via ORAL
  Filled 2017-10-06 (×9): qty 1

## 2017-10-06 MED ORDER — HYDROCHLOROTHIAZIDE 12.5 MG PO CAPS
12.5000 mg | ORAL_CAPSULE | Freq: Every day | ORAL | Status: DC
  Administered 2017-10-06 – 2017-10-13 (×8): 12.5 mg via ORAL
  Filled 2017-10-06 (×8): qty 1

## 2017-10-06 MED ORDER — SENNOSIDES-DOCUSATE SODIUM 8.6-50 MG PO TABS
2.0000 | ORAL_TABLET | Freq: Two times a day (BID) | ORAL | Status: DC
  Administered 2017-10-06 – 2017-10-14 (×13): 2 via ORAL
  Filled 2017-10-06 (×14): qty 2

## 2017-10-06 MED ORDER — ALBUTEROL SULFATE (2.5 MG/3ML) 0.083% IN NEBU: 2.5000 mg | INHALATION_SOLUTION | Freq: Four times a day (QID) | RESPIRATORY_TRACT | Status: DC | PRN

## 2017-10-06 MED ORDER — ACETAMINOPHEN 325 MG PO TABS
650.0000 mg | ORAL_TABLET | Freq: Four times a day (QID) | ORAL | Status: DC | PRN
Start: 2017-10-06 — End: 2017-10-12
  Administered 2017-10-06: 650 mg via ORAL
  Filled 2017-10-06: qty 2

## 2017-10-06 NOTE — Social Work (Signed)
CSW reviewed chart and it appears that patient from SNF as she was recently dc from hospital for rehab. Per RNCM, from SNF as well. Pt possibly from [facility].  CSW will need to f/u and confirm.    CSW will follow for disposition.  Elissa Hefty, LCSW Clinical Social Worker [phone]

## 2017-10-06 NOTE — Progress Notes (Signed)
Nutrition Consult/Brief Note  RD consulted for nutrition assessment via hip fracture protocol.  Wt Readings from Last 15 Encounters:  10/06/17 246 lb (111.6 kg)  10/05/17 246 lb (111.6 kg)  10/04/17 246 lb (111.6 kg)  09/16/17 250 lb (113.4 kg)  09/16/17 250 lb (113.4 kg)  09/14/17 249 lb 9.6 oz (113.2 kg)  09/09/17 249 lb 9.6 oz (113.2 kg)  12/09/14 236 lb (107 kg)   Body mass index is 53.23 kg/m. Patient meets criteria for Obesity Class III based on current BMI.   Current diet order is Regular, patient is consuming approximately 100% of meals at this time. Labs and medications reviewed.   No nutrition interventions warranted at this time. If nutrition issues arise, please consult RD.   Arthur Holms, RD, LDN Pager #: [phone] After-Hours Pager #: [phone]

## 2017-10-06 NOTE — Progress Notes (Signed)
PROGRESS NOTE  Michele Mcgee KLK:[id number] DOB: 24-Sep-1945 DOA: 10/05/2017 PCP: Kirk Ruths, MD  HPI/Recap of past 62 hours: 72 year old female with past medical history of hypertension, CAD and recent lumbar fusion at L3-4 weeks ago secondary to lumbar spinal stenosis and radiculopathy who was at rehab and then had a fall landing on her left side.  Started having severe pain in her left upper thigh down to her foot most on the lateral side 10/10.  Unable to stand.  Seen on 6/25 with a negative CT of the hip, but pain persisted so she was reevaluated in the emergency room the night of 6/26 at [facility].  CT of the lumbar spine was done showing questionable hardware loosening and fracture of the lateral vertebral body cortex.  Emergency room physician spoke with neurosurgery who advised patient to be transferred over to [facility].  Patient was admitted and is currently stable.  Seen by neurosurgery who is ordered an MRI of the hip and spine.  Patient herself complains of some pain when moving, otherwise okay.  No other complaints.  Assessment/Plan: Principal Problem:   Fall with left leg pain and suspected subcapital femur fracture: Awaiting MRI, informed, will discuss with orthopedic surgery.  If there is indeed hardware loosening, neurosurgery will follow up Active Problems:   Coronary artery disease: Stable   Depression: Stable, no suicidal ideations.  Continue home medications   Essential hypertension: Continue antihypertensive medications   Morbid obesity [facility]): Patient meets criteria BMI greater than 40.   Code Status: Full code  Family Communication: Left message for family  Disposition Plan: Depending on MRI results surgery before rehab   Consultants:  Dr. Miguel Rota  Procedures:  Pending MRI results  Antimicrobials:  None  DVT prophylaxis: Subcu heparin   Objective: Vitals:   10/06/17 0018 10/06/17 0636  BP: (!) 123/59 120/66  Pulse: 80 77    Temp: 98.2 F (36.8 C) 98 F (36.7 C)  TempSrc:  Oral  SpO2: 91% 92%    Intake/Output Summary (Last 24 hours) at 10/06/2017 0907 Last data filed at 10/06/2017 0636 Gross per 24 hour  Intake -  Output 600 ml  Net -600 ml   Vitals:   10/06/17 0018 10/06/17 0636  BP: (!) 123/59 120/66  Pulse: 80 77  Temp: 98.2 F (36.8 C) 98 F (36.7 C)  SpO2: 91% 92%    Exam:   General: Alert and oriented x3, no acute distress  HEENT: Normocephalic and atraumatic, mucous memories are slightly dry  Cardiovascular: Regular rate and rhythm, S1-S2  Respiratory: Clear to auscultation bilaterally  Abdomen: Soft, nontender, nondistended, hypoactive bowel sounds  Musculoskeletal: Trace pitting edema.  Pain with mild attempt at left leg straight raise  Skin: No skin breaks, tears or lesions  Psychiatry: Appropriate, no evidence of psychoses   Data Reviewed: CBC: Recent Labs  Lab 10/05/17 1810 10/06/17 0030  WBC 7.3 8.1  HGB 12.1 12.1  HCT 35.7 37.4  MCV 91.4 94.0  PLT 270 979   Basic Metabolic Panel: Recent Labs  Lab 10/05/17 1810 10/06/17 0030  NA 138 137  K 3.9 4.2  CL 98 96*  CO2 28 30  GLUCOSE 133* 164*  BUN 15 13  CREATININE 0.67 0.71  CALCIUM 9.0 9.4   GFR: Estimated Creatinine Clearance: 68 mL/min (by C-G formula based on SCr of 0.71 mg/dL). Liver Function Tests: No results for input(s): AST, ALT, ALKPHOS, BILITOT, PROT, ALBUMIN in the last 168 hours. No results for input(s): LIPASE,  AMYLASE in the last 168 hours. No results for input(s): AMMONIA in the last 168 hours. Coagulation Profile: Recent Labs  Lab 10/06/17 0030  INR 1.09   Cardiac Enzymes: No results for input(s): CKTOTAL, CKMB, CKMBINDEX, TROPONINI in the last 168 hours. BNP (last 3 results) No results for input(s): PROBNP in the last 8760 hours. HbA1C: No results for input(s): HGBA1C in the last 72 hours. CBG: No results for input(s): GLUCAP in the last 168 hours. Lipid Profile: No  results for input(s): CHOL, HDL, LDLCALC, TRIG, CHOLHDL, LDLDIRECT in the last 72 hours. Thyroid Function Tests: No results for input(s): TSH, T4TOTAL, FREET4, T3FREE, THYROIDAB in the last 72 hours. Anemia Panel: No results for input(s): VITAMINB12, FOLATE, FERRITIN, TIBC, IRON, RETICCTPCT in the last 72 hours. Urine analysis: No results found for: COLORURINE, APPEARANCEUR, LABSPEC, PHURINE, GLUCOSEU, HGBUR, BILIRUBINUR, KETONESUR, PROTEINUR, UROBILINOGEN, NITRITE, LEUKOCYTESUR Sepsis Labs: @LABRCNTIP (procalcitonin:4,lacticidven:4)  )No results found for this or any previous visit (from the past 240 hour(s)).    Studies: Ct Lumbar Spine Wo Contrast  Result Date: 10/05/2017 CLINICAL DATA:  Recent surgery, followed by a more recent fall. Hip pain and back pain. EXAM: CT LUMBAR SPINE WITHOUT CONTRAST TECHNIQUE: Multidetector CT imaging of the lumbar spine was performed without intravenous contrast administration. Multiplanar CT image reconstructions were also generated. COMPARISON:  Lumbar spine radiograph 09/16/2017, 08/19/2017 Lumbar spine MRI 05/20/2017 FINDINGS: Segmentation: There is transitional lumbosacral anatomy. Numbering is preserved from the prior studies, with the fused levels considered to be L3-L4. Alignment: Normal alignment. Vertebrae: Old superior endplate fracture of L3 is unchanged. There is PLIF hardware at L3-L4. There is lucency surrounding both L4 screws with irregularity of the lateral vertebral body cortex (series 4, image 101) on the left. Paraspinal and other soft tissues: There is calcific aortic atherosclerosis. Disc levels: There is no stenosis or large disc herniation of the disc levels above L2. At L2-L3, there is no spinal canal stenosis or neural foraminal stenosis. There is mild disc space narrowing and facet hypertrophy. No residual spinal canal stenosis at L4-L5. Neural foramina are patent. At L5-S1, there is mild bilateral foraminal narrowing. IMPRESSION: 1. L3-L4  PLIF with marked lucency surrounding both L4 transpedicular screws, with irregularity of the left lateral vertebral body cortex. With recent surgery and no available prior lumbar spine CT, the acuity of the findings is uncertain, but given the patient's recent fall, this is concerning for hardware loosening and fracture of the lateral vertebral body cortex. 2. No spinal canal stenosis. 3. Mild bilateral L5-S1 neural foraminal narrowing. 4.  Aortic Atherosclerosis (ICD10-I70.0). Electronically Signed   By: Ulyses Jarred M.D.   On: 10/05/2017 19:22   Dg Femur Min 2 Views Left  Result Date: 10/05/2017 CLINICAL DATA:  Patient fell Sunday and now has left hip pain. EXAM: LEFT FEMUR 2 VIEWS COMPARISON:  None. FINDINGS: Suspicious subcapital left femoral neck lucency raises concern for a nondisplaced fracture. CT of the left hip is recommended for better assessment. Osteoarthritic spurring of the left acetabular roof is noted. The adjacent left pubic rami appear intact. No acute abnormality of the adjacent left SI joint. The left femoral shaft and femoral condyles are unremarkable. There is mild joint space narrowing of the included knee. No joint effusion is visualized. IMPRESSION: Suspicious lucency at the base of the left femoral head raising concern for possible nondisplaced subcapital left femoral neck fracture. CT is therefore recommended for better correlation. Electronically Signed   By: Ashley Royalty M.D.   On: 10/05/2017 19:37  Scheduled Meds: . aspirin  81 mg Oral Daily  . atorvastatin  40 mg Oral QPM  . calcium-vitamin D  1 tablet Oral BID  . escitalopram  20 mg Oral Daily  . heparin  5,000 Units Subcutaneous Q8H  . lisinopril  10 mg Oral Daily   And  . hydrochlorothiazide  12.5 mg Oral Daily  . metoprolol succinate  25 mg Oral Daily  . multivitamin with minerals  1 tablet Oral Daily  . pantoprazole  40 mg Oral Daily  . senna-docusate  2 tablet Oral BID    Continuous Infusions:   LOS: 1  day     Annita Brod, MD [facility]  To reach me or the doctor on call, go to: [email or web address] Password [facility]  10/06/2017, 9:07 AM

## 2017-10-06 NOTE — Progress Notes (Signed)
Subjective: Michele Mcgee is a 72 year old morbidly obese white female on whom I performed an L3-4 decompression, instrumentation and fusion about 3 weeks ago.  Initially she did great and requested discharge on postoperative day #1.  Over the next few days she had increasing pain and was readmitted and subsequently sent to [facility] skilled nursing facility.  She tells me she was doing fairly well and seemed to be progressing until she took a fall 4 days ago.  Since then she is complaining of left leg pain.  She has been to the ER twice.  Lumbar x-rays were unremarkable.  Hip x-rays were questionable for a left femur fracture.  She had a lumbar CT yesterday which demonstrated some possible loosening of the lower screws.  We were already in the process of scheduling an outpatient lumbar MRI.  She is going to get a lumbar MRI and lumbar left hip MRI.  She complains of some back pain.  She mainly complains of pain in her left leg.  Objective: Vital signs in last 24 hours: Temp:  [98 F (36.7 C)-98.6 F (37 C)] 98 F (36.7 C) (06/27 0636) Pulse Rate:  [68-85] 77 (06/27 0636) Resp:  [14-23] 14 (06/26 2130) BP: (120-158)/(59-82) 120/66 (06/27 0636) SpO2:  [89 %-97 %] 92 % (06/27 0636) Weight:  [111.6 kg (246 lb)] 111.6 kg (246 lb) (06/26 1759) Estimated body mass index is 53.23 kg/m as calculated from the following:   Height as of an earlier encounter on 10/05/17: 4\' 9"  (1.448 m).   Weight as of an earlier encounter on 10/05/17: 111.6 kg (246 lb).   Intake/Output from previous day: 06/26 0701 - 06/27 0700 In: -  Out: 600 [Urine:600] Intake/Output this shift: No intake/output data recorded.  Physical exam the patient is alert and oriented.  She is pleasant.  She is in no apparent distress.  Her lower extremity strength is grossly normal in her bilateral gastrocnemius and right EHL.  She has slight weakness in the left EHL 4+/5.  Faber's testing is positive.  She complains of severe pain  with a slice of motion of her left leg and hip.  I could not do a straight leg raise test because of this pain.  I reviewed the patient's lumbar CT performed yesterday.  There is good position of the instrumentation at L3-4.  I do not see any obvious neural compression.  There may be some loosening of the lower screws.  Lab Results: Recent Labs    10/05/17 1810 10/06/17 0030  WBC 7.3 8.1  HGB 12.1 12.1  HCT 35.7 37.4  PLT 270 234   BMET Recent Labs    10/05/17 1810 10/06/17 0030  NA 138 137  K 3.9 4.2  CL 98 96*  CO2 28 30  GLUCOSE 133* 164*  BUN 15 13  CREATININE 0.67 0.71  CALCIUM 9.0 9.4    Studies/Results: Ct Lumbar Spine Wo Contrast  Result Date: 10/05/2017 CLINICAL DATA:  Recent surgery, followed by a more recent fall. Hip pain and back pain. EXAM: CT LUMBAR SPINE WITHOUT CONTRAST TECHNIQUE: Multidetector CT imaging of the lumbar spine was performed without intravenous contrast administration. Multiplanar CT image reconstructions were also generated. COMPARISON:  Lumbar spine radiograph 09/16/2017, 08/19/2017 Lumbar spine MRI 05/20/2017 FINDINGS: Segmentation: There is transitional lumbosacral anatomy. Numbering is preserved from the prior studies, with the fused levels considered to be L3-L4. Alignment: Normal alignment. Vertebrae: Old superior endplate fracture of L3 is unchanged. There is PLIF hardware at L3-L4. There is  lucency surrounding both L4 screws with irregularity of the lateral vertebral body cortex (series 4, image 101) on the left. Paraspinal and other soft tissues: There is calcific aortic atherosclerosis. Disc levels: There is no stenosis or large disc herniation of the disc levels above L2. At L2-L3, there is no spinal canal stenosis or neural foraminal stenosis. There is mild disc space narrowing and facet hypertrophy. No residual spinal canal stenosis at L4-L5. Neural foramina are patent. At L5-S1, there is mild bilateral foraminal narrowing. IMPRESSION: 1.  L3-L4 PLIF with marked lucency surrounding both L4 transpedicular screws, with irregularity of the left lateral vertebral body cortex. With recent surgery and no available prior lumbar spine CT, the acuity of the findings is uncertain, but given the patient's recent fall, this is concerning for hardware loosening and fracture of the lateral vertebral body cortex. 2. No spinal canal stenosis. 3. Mild bilateral L5-S1 neural foraminal narrowing. 4.  Aortic Atherosclerosis (ICD10-I70.0). Electronically Signed   By: Ulyses Jarred M.D.   On: 10/05/2017 19:22   Ct Hip Left Wo Contrast  Result Date: 10/04/2017 CLINICAL DATA:  Left hip and leg pain since a fall 2 days ago. EXAM: CT OF THE LEFT HIP WITHOUT CONTRAST TECHNIQUE: Multidetector CT imaging of the left hip was performed according to the standard protocol. Multiplanar CT image reconstructions were also generated. COMPARISON:  None. FINDINGS: Bones/Joint/Cartilage There is no fracture or dislocation. There are degenerative changes at the symphysis pubis. Slight marginal osteophyte formation on the left acetabulum. Muscles and Tendons Normal. Soft tissues Normal. IMPRESSION: No acute abnormality of the left hip. Electronically Signed   By: Lorriane Shire M.D.   On: 10/04/2017 09:40   Dg Femur Min 2 Views Left  Result Date: 10/05/2017 CLINICAL DATA:  Patient fell Sunday and now has left hip pain. EXAM: LEFT FEMUR 2 VIEWS COMPARISON:  None. FINDINGS: Suspicious subcapital left femoral neck lucency raises concern for a nondisplaced fracture. CT of the left hip is recommended for better assessment. Osteoarthritic spurring of the left acetabular roof is noted. The adjacent left pubic rami appear intact. No acute abnormality of the adjacent left SI joint. The left femoral shaft and femoral condyles are unremarkable. There is mild joint space narrowing of the included knee. No joint effusion is visualized. IMPRESSION: Suspicious lucency at the base of the left femoral  head raising concern for possible nondisplaced subcapital left femoral neck fracture. CT is therefore recommended for better correlation. Electronically Signed   By: Ashley Royalty M.D.   On: 10/05/2017 19:37    Assessment/Plan: Left hip, left leg pain: I have discussed the situation with the patient.  It is difficult to know whether this pain is coming from her back or from her hip.  She has a positive left Faber's test.  I will plan to advise her further after the lumbar and hip MRI.  I have answered all her questions.  LOS: 1 day     Ophelia Charter 10/06/2017, 8:02 AM

## 2017-10-07 ENCOUNTER — Inpatient Hospital Stay (HOSPITAL_COMMUNITY): Payer: Medicare Other

## 2017-10-07 DIAGNOSIS — S76012A Strain of muscle, fascia and tendon of left hip, initial encounter: Secondary | ICD-10-CM

## 2017-10-07 DIAGNOSIS — S76312A Strain of muscle, fascia and tendon of the posterior muscle group at thigh level, left thigh, initial encounter: Secondary | ICD-10-CM

## 2017-10-07 MED ORDER — MORPHINE SULFATE (PF) 4 MG/ML IV SOLN
3.0000 mg | INTRAVENOUS | Status: DC | PRN
  Administered 2017-10-07 – 2017-10-08 (×8): 3 mg via INTRAVENOUS
  Filled 2017-10-07 (×8): qty 1

## 2017-10-07 MED ORDER — METHOCARBAMOL 1000 MG/10ML IJ SOLN
500.0000 mg | Freq: Four times a day (QID) | INTRAVENOUS | Status: DC | PRN
  Administered 2017-10-07 – 2017-10-11 (×9): 500 mg via INTRAVENOUS
  Filled 2017-10-07: qty 550
  Filled 2017-10-07 (×8): qty 5

## 2017-10-07 MED ORDER — GADOBENATE DIMEGLUMINE 529 MG/ML IV SOLN
20.0000 mL | Freq: Once | INTRAVENOUS | Status: AC | PRN
  Administered 2017-10-07: 20 mL via INTRAVENOUS

## 2017-10-07 MED ORDER — GABAPENTIN 100 MG PO CAPS
100.0000 mg | ORAL_CAPSULE | Freq: Two times a day (BID) | ORAL | Status: DC
  Administered 2017-10-07 – 2017-10-14 (×15): 100 mg via ORAL
  Filled 2017-10-07 (×15): qty 1

## 2017-10-07 MED ORDER — SODIUM CHLORIDE 0.9 % IV SOLN
INTRAVENOUS | Status: DC
  Administered 2017-10-07 – 2017-10-08 (×2): via INTRAVENOUS

## 2017-10-07 MED ORDER — LIDOCAINE 5 % EX PTCH
1.0000 | MEDICATED_PATCH | CUTANEOUS | Status: DC
  Administered 2017-10-07 – 2017-10-10 (×4): 1 via TRANSDERMAL
  Filled 2017-10-07 (×5): qty 1

## 2017-10-07 NOTE — Plan of Care (Signed)
  Problem: Education: Goal: Knowledge of General Education information will improve Outcome: Progressing   Problem: Nutrition: Goal: Adequate nutrition will be maintained Outcome: Progressing   Problem: Coping: Goal: Level of anxiety will decrease Outcome: Progressing   Problem: Pain Managment: Goal: General experience of comfort will improve Outcome: Progressing

## 2017-10-07 NOTE — Progress Notes (Addendum)
PROGRESS NOTE  Michele Mcgee JEH:[id number] DOB: Aug 04, 1945 DOA: 10/05/2017 PCP: Kirk Ruths, MD  HPI/Recap of past 72 hours: 72 year old female with past medical history of hypertension, CAD and recent lumbar fusion at L3-4 weeks ago secondary to lumbar spinal stenosis and radiculopathy who was at rehab and then had a fall landing on her left side.  Started having severe pain in her left upper thigh down to her foot most on the lateral side 10/10.  Unable to stand.  Seen on 6/25 with a negative CT of the hip, but pain persisted so she was reevaluated in the emergency room the night of 6/26 at [facility].  CT of the lumbar spine was done showing questionable hardware loosening and fracture of the lateral vertebral body cortex.  Emergency room physician spoke with neurosurgery who advised patient to be transferred over to [facility].  Patient was admitted and is currently stable.  Seen by neurosurgery who is ordered an MRI of the hip and spine.    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  Patient's MRI of left hip notes no evidence of hip fracture, but does note strain and low-grade partial tear of left gluteus maximus myotendinous junction and of the left hamstring.  Patient had rough night and following need to stand for MRI is in severe pain on left side.  Morphine helps partially, but then quickly wears off.  Assessment/Plan: Principal Problem:   Fall with partial tears of left gluteus maximus myotendinous junction and left hamstring: Discussed with orthopedic surgery PA who will review films and discussed with orthopedic attending as to best treatment options.  In the meantime, patient having significant uncontrolled pain in that left side even with any type of movement. At this point, she is not even able to move her leg little  on stand. Have added lidocaine patch, increased   IV morphine to 3 mg every 2 hours as needed.   Have also needed change or back soon to IV in hopes to lead to pain controlled breakthrough.  She has also had very little p.o. Intake during the day, so adding gentle IV fluids for hydration. Active Problems:   Coronary artery disease: Stable   Depression: Stable, no suicidal ideations.  Continue home medications   Essential hypertension: Continue antihypertensive medications   Morbid obesity [facility]): Patient meets criteria BMI greater than 40. Recent lumbar fusion: MRI does not make mention of loosened hardware.  Does note fluid collection.?  Significance.  Awaiting neurosurgical follow-up   Code Status: Full code  Family Communication: Daughter at the bedside  Disposition Plan: Eventually return back to skilled nursing.  Awaiting follow-up from neurosurgery and orthopedics   Consultants:  Dr. Miguel Rota  Procedures:  None  Antimicrobials:  None  DVT prophylaxis: Subcu heparin   Objective: Vitals:   10/06/17 1642 10/06/17 1800 10/06/17 2012 10/07/17 0420  BP: (!) 128/53  (!) 116/46 (!) 119/49  Pulse: 74  73 70  Resp:  18 17   Temp: 98 F (36.7 C)  99 F (37.2 C) 98.2 F (36.8 C)  TempSrc: Oral  Oral Oral  SpO2: 100%  96% 97%  Weight:      Height:        Intake/Output Summary (Last 24 hours) at 10/07/2017 1126 Last data filed at 10/06/2017 2300 Gross per 24 hour  Intake 240 ml  Output 900 ml  Net -660 ml   Vitals:   10/06/17 2012 10/07/17 0420  BP: (!) 116/46 (!) 119/49  Pulse: 73 70  Resp: 17   Temp: 99 F (37.2 C) 98.2 F (36.8 C)  SpO2: 96% 97%    Exam:   General: Alert and oriented x3, moderate distress secondary to pain  HEENT: Normocephalic and atraumatic, mucous memories are slightly dry  Cardiovascular: Regular rate and rhythm, S1-S2  Respiratory: Clear to auscultation bilaterally  Abdomen: Soft, nontender, nondistended,  hypoactive bowel sounds  Musculoskeletal: Trace pitting edema.  Did not attempt any type of muscular skeletal exam secondary to patient's resistance and severe pain  Skin: No skin breaks, tears or lesions  Psychiatry: Appropriate, no evidence of psychoses   Data Reviewed: CBC: Recent Labs  Lab 10/05/17 1810 10/06/17 0030  WBC 7.3 8.1  HGB 12.1 12.1  HCT 35.7 37.4  MCV 91.4 94.0  PLT 270 267   Basic Metabolic Panel: Recent Labs  Lab 10/05/17 1810 10/06/17 0030  NA 138 137  K 3.9 4.2  CL 98 96*  CO2 28 30  GLUCOSE 133* 164*  BUN 15 13  CREATININE 0.67 0.71  CALCIUM 9.0 9.4   GFR: Estimated Creatinine Clearance: 68 mL/min (by C-G formula based on SCr of 0.71 mg/dL). Liver Function Tests: No results for input(s): AST, ALT, ALKPHOS, BILITOT, PROT, ALBUMIN in the last 168 hours. No results for input(s): LIPASE, AMYLASE in the last 168 hours. No results for input(s): AMMONIA in the last 168 hours. Coagulation Profile: Recent Labs  Lab 10/06/17 0030  INR 1.09   Cardiac Enzymes: No results for input(s): CKTOTAL, CKMB, CKMBINDEX, TROPONINI in the last 168 hours. BNP (last 3 results) No results for input(s): PROBNP in the last 8760 hours. HbA1C: No results for input(s): HGBA1C in the last 72 hours. CBG: No results for input(s): GLUCAP in the last 168 hours. Lipid Profile: No results for input(s): CHOL, HDL, LDLCALC, TRIG, CHOLHDL, LDLDIRECT in the last 72 hours. Thyroid Function Tests: No results for input(s): TSH, T4TOTAL, FREET4, T3FREE, THYROIDAB in the last 72 hours. Anemia Panel: No results for input(s): VITAMINB12, FOLATE, FERRITIN, TIBC, IRON, RETICCTPCT in the last 72 hours. Urine analysis: No results found for: COLORURINE, APPEARANCEUR, LABSPEC, PHURINE, GLUCOSEU, HGBUR, BILIRUBINUR, KETONESUR, PROTEINUR, UROBILINOGEN, NITRITE, LEUKOCYTESUR Sepsis Labs: @LABRCNTIP (procalcitonin:4,lacticidven:4)  )No results found for this or any previous visit (from the  past 240 hour(s)).    Studies: Mr Lumbar Spine W Wo Contrast  Result Date: 10/07/2017 CLINICAL DATA:  Left leg pain predominantly located laterally after a recent fall. Numbness at times. Lumbar fusion on 09/14/2017. Concern for L4 screw loosening and vertebral body fracture on CT. EXAM: MRI LUMBAR SPINE WITHOUT AND WITH CONTRAST TECHNIQUE: Multiplanar and multiecho pulse sequences of the lumbar spine were obtained without and with intravenous contrast. CONTRAST:  53mL MULTIHANCE GADOBENATE DIMEGLUMINE 529 MG/ML IV SOLN COMPARISON:  Lumbar spine CT 10/05/2017 and MRI 05/20/2017 FINDINGS: The study is intermittently up to moderately motion degraded. Segmentation: Transitional lumbosacral anatomy with partial sacralization of L5, the same numbering as previously used. Alignment: Exaggerated lumbar lordosis. Trace retrolisthesis of L2 on L3. Mild lumbar levoscoliosis. Vertebrae: L3-4 posterior and interbody fusion with susceptibility artifact limiting assessment of this region including for acute fracture. A chronic L3 superior endplate compression fracture is again noted. No suspicious osseous lesion is identified. Conus medullaris and cauda equina: Conus extends to the L1 level. There is a small T2 hyperintense subdural fluid collection  ventrally at L1-2. There may be a small amount of dorsal subdural or epidural fluid at L4-5. Diffuse smooth dural enhancement is noted throughout the lumbar spine extending into the sacral canal as well as the included lower thoracic spine. Paraspinal and other soft tissues: Postoperative changes in the posterior lumbar soft tissues. 3.8 x 3.4 x 8.8 cm subcutaneous fluid collection from T12-L4. 4.0 x 2.6 x 2.6 cm dorsal epidural fluid collection in the laminectomy bed at L3. Disc levels: T11-12: Mild disc space narrowing. Mild disc bulging, small central disc protrusion, and mild facet hypertrophy without significant stenosis, unchanged from the prior MRI. T12-L1: Mild facet  hypertrophy without disc herniation or stenosis, unchanged. L1-2: Effacement of the subarachnoid CSF by the subdural collection. Mild facet hypertrophy. No disc herniation or degenerative stenosis. L2-3: Interval laminectomies. Dorsal epidural fluid collection moderately narrows the thecal sac, greatest at the mid L3 vertebral body level. The neural foramina are patent. L3-4: Interval laminectomies. Evaluation limited by motion artifact through this level on axial images. Diffuse epidural enhancement which may represent postoperative granulation tissue. Grossly patent neural foramina. L4-5: Limited assessment due to motion and susceptibility artifact. Partial effacement of the thecal sac by dural/epidural soft tissue thickening and possible small fluid collection. Poorly evaluated neural foramina. L5-S1: Interbody ankylosis. Prior right laminectomy. No significant stenosis. IMPRESSION: 1. Motion degraded examination with recent postoperative changes as above. Dorsal epidural collection at L3 moderately narrows the spinal canal. 2. Small subdural fluid collection and diffuse dural enhancement may be postoperative. Correlate for any clinical or laboratory evidence of infection. Electronically Signed   By: Logan Bores M.D.   On: 10/07/2017 08:47   Mr Hip Left W Wo Contrast  Result Date: 10/07/2017 CLINICAL DATA:  Left leg pain after fall on Sunday. Recent lumbar spine surgery. EXAM: MRI OF THE LEFT HIP WITHOUT AND WITH CONTRAST TECHNIQUE: Multiplanar, multisequence MR imaging was performed both before and after administration of intravenous contrast. CONTRAST:  17mL MULTIHANCE GADOBENATE DIMEGLUMINE 529 MG/ML IV SOLN COMPARISON:  CT left hip dated October 04, 2017. FINDINGS: Bones: There is no evidence of acute fracture, dislocation or avascular necrosis. Mild degenerative changes of the pubic symphysis and left sacroiliac joint. Articular cartilage and labrum Articular cartilage: No focal chondral defect or  subchondral signal abnormality identified. Labrum: There is no gross labral tear or paralabral abnormality. Joint or bursal effusion Joint effusion: No significant hip joint effusion. Bursae: No periarticular fluid collection. Muscles and tendons Muscles and tendons: Low-grade partial tear of the left hamstring tendon origin. Fluid along the left gluteus maximus myotendinous junction, consistent with strain and low-grade partial tear. Remaining bilateral gluteal tendons are unremarkable. The right hamstring and bilateral iliopsoas tendons are intact. Mild bilateral piriformis muscle atrophy. Diffuse fatty atrophy of the right tensor fascia lata. Other findings Miscellaneous: Mild superficial soft tissue swelling over the lateral hip. No soft tissue mass. Prior hysterectomy. The visualized internal pelvic contents otherwise appear unremarkable. IMPRESSION: 1.  No acute osseous abnormality. 2. Strain and low-grade partial tear of the left gluteus maximus myotendinous junction. 3. Low-grade partial tear of the left hamstring tendon origin. Electronically Signed   By: Titus Dubin M.D.   On: 10/07/2017 10:01    Scheduled Meds: . aspirin  81 mg Oral Daily  . atorvastatin  40 mg Oral QPM  . calcium-vitamin D  1 tablet Oral BID  . escitalopram  20 mg Oral Daily  . heparin  5,000 Units Subcutaneous Q8H  . lisinopril  10 mg Oral Daily  And  . hydrochlorothiazide  12.5 mg Oral Daily  . lidocaine  1 patch Transdermal Q24H  . metoprolol succinate  25 mg Oral Daily  . multivitamin with minerals  1 tablet Oral Daily  . pantoprazole  40 mg Oral Daily  . senna-docusate  2 tablet Oral BID    Continuous Infusions: . methocarbamol (ROBAXIN)  IV       LOS: 2 days     Annita Brod, MD [facility]  To reach me or the doctor on call, go to: [email or web address] Password [facility]  10/07/2017, 11:26 AM

## 2017-10-07 NOTE — Care Management Important Message (Signed)
Important Message  Patient Details  Name: Michele Mcgee MRN: [id number] Date of Birth: 11-Aug-1945   Medicare Important Message Given:  Yes    Orbie Pyo 10/07/2017, 2:24 PM

## 2017-10-07 NOTE — Clinical Social Work Note (Signed)
Clinical Social Work Assessment  Patient Details  Name: Michele Mcgee MRN: [id number] Date of Birth: June 12, 1945  Date of referral:  10/07/17               Reason for consult:  Facility Placement                Permission sought to share information with:  Facility Sport and exercise psychologist, Family Supports Permission granted to share information::  Yes, Verbal Permission Granted  Name::     Engineer, drilling::  [place]  Relationship::  Daughter  Contact Information:  [phone]  Housing/Transportation Living arrangements for the past 2 months:  [place] of Information:  Patient, Adult Children Patient Interpreter Needed:  None Criminal Activity/Legal Involvement Pertinent to Current Situation/Hospitalization:  No - Comment as needed Significant Relationships:  Adult Children Lives with:  Facility Resident Do you feel safe going back to the place where you live?  Yes Need for family participation in patient care:  No (Coment)  Care giving concerns:  Pt is alert and oriented. Pt came from [facility].   Social Worker assessment / plan:  CSW spoke with pt at bedside. Pt's daughter was present. Pt is from [facility] and the plan is for pt to return at d/c. Pt was in a lot of pain. Pt's daughter concerned regarding pt's pain.  Employment status:  Retired Nurse, adult PT Recommendations:  [place] / Referral to community resources:  [place]  Patient/Family's Response to care:  Pt verbalized understanding of CSW role and expressed appreciation for support. Pt's pain is a concern regarding pt care at this time.   Patient/Family's Understanding of and Emotional Response to Diagnosis, Current Treatment, and Prognosis:  Pt understanding and realistic regarding pt's physical limitations. Pt understands the need to return to SNF at d/c--Pt agreeable, at this time. Pt's responses  emotionally appropriate during conversation with CSW. Pt's only concern is her pain at this time. CSW will continue to provide support and facilitate d/c needs.   Emotional Assessment Appearance:  Appears stated age Attitude/Demeanor/Rapport:  (Patient was in pain.) Affect (typically observed):  Accepting, Appropriate, Irritable Orientation:  Oriented to Self, Oriented to Place, Oriented to  Time, Oriented to Situation Alcohol / Substance use:  Not Applicable Psych involvement (Current and /or in the community):  No (Comment)  Discharge Needs  Concerns to be addressed:  Basic Needs, Care Coordination Readmission within the last 30 days:  Yes Current discharge risk:  Dependent with Mobility Barriers to Discharge:  Continued Medical Work up   [employer], LCSW 10/07/2017, 1:24 PM

## 2017-10-07 NOTE — NC FL2 (Signed)
[place] LEVEL OF CARE SCREENING TOOL     IDENTIFICATION  Patient Name: Michele Mcgee Birthdate: 11/21/1945 Sex: female Admission Date (Current Location): 10/05/2017  [facility] and [place] Number:  Herbalist and Address:  The [place]. [facility], Eugenio Saenz [address], [place], [place] [address]      Provider Number: [id number]  Attending Physician Name and Address:  Annita Brod, MD  Relative Name and Phone Number:       Current Level of Care: Hospital Recommended Level of Care: [place] Prior Approval Number:    Date Approved/Denied:   PASRR Number: [id number]  Discharge Plan: SNF    Current Diagnoses: Patient Active Problem List   Diagnosis Date Noted  . Tear of left gluteus medius tendon 10/07/2017  . Tear of left hamstring 10/06/2017  . Left leg pain 10/06/2017  . Depression 10/06/2017  . Essential hypertension 10/06/2017  . Morbid obesity ([place]) 10/06/2017  . Fall 10/05/2017  . Coronary artery disease   . GERD (gastroesophageal reflux disease)   . HLD (hyperlipidemia)   . Post-operative pain 09/16/2017  . Spondylolisthesis of lumbar region 09/14/2017    Orientation RESPIRATION BLADDER Height & Weight     Self, Time, Place, Situation  Normal Continent Weight: 246 lb (111.6 kg) Height:  4\' 9"  (144.8 cm)  BEHAVIORAL SYMPTOMS/MOOD NEUROLOGICAL BOWEL NUTRITION STATUS      Continent Diet(Regular diet, thin liquids)  AMBULATORY STATUS COMMUNICATION OF NEEDS Skin   Limited Assist Verbally Surgical wounds(Closed incision; back)                       Personal Care Assistance Level of Assistance  Bathing, Feeding, Dressing Bathing Assistance: Limited assistance Feeding assistance: Independent Dressing Assistance: Limited assistance     Functional Limitations Info  Sight, Hearing, Speech Sight Info: Adequate Hearing Info: Adequate Speech Info: Adequate    SPECIAL CARE FACTORS FREQUENCY  PT (By  licensed PT), OT (By licensed OT)     PT Frequency: 5x OT Frequency: 5x            Contractures Contractures Info: Not present    Additional Factors Info  Code Status, Allergies Code Status Info: Full Code Allergies Info: Oxycodone, Ampicillin Psychotropic Info: escitalopram (LEXAPRO) tablet 20 mg daily         Current Medications (10/07/2017):  This is the current hospital active medication list Current Facility-Administered Medications  Medication Dose Route Frequency Provider Last Rate Last Dose  . acetaminophen (TYLENOL) tablet 650 mg  650 mg Oral Q6H PRN Ivor Costa, MD   650 mg at 10/06/17 1516  . albuterol (PROVENTIL) (2.5 MG/3ML) 0.083% nebulizer solution 2.5 mg  2.5 mg Nebulization Q6H PRN Ivor Costa, MD      . ALPRAZolam Duanne Moron) tablet 0.25 mg  0.25 mg Oral Daily PRN Ivor Costa, MD   0.25 mg at 10/06/17 1433  . aspirin chewable tablet 81 mg  81 mg Oral Daily Ivor Costa, MD   81 mg at 10/07/17 0943  . atorvastatin (LIPITOR) tablet 40 mg  40 mg Oral QPM Ivor Costa, MD   40 mg at 10/06/17 1814  . calcium-vitamin D (OSCAL WITH D) 500-200 MG-UNIT per tablet 1 tablet  1 tablet Oral BID Ivor Costa, MD   1 tablet at 10/07/17 0944  . escitalopram (LEXAPRO) tablet 20 mg  20 mg Oral Daily Ivor Costa, MD   20 mg at 10/07/17 0943  . fluticasone (FLONASE) 50 MCG/ACT  nasal spray 1 spray  1 spray Each Nare Daily PRN Ivor Costa, MD      . gabapentin (NEURONTIN) capsule 100 mg  100 mg Oral BID Annita Brod, MD      . heparin injection 5,000 Units  5,000 Units Subcutaneous Q8H Ivor Costa, MD   5,000 Units at 10/06/17 2212  . hydrALAZINE (APRESOLINE) injection 5 mg  5 mg Intravenous Q2H PRN Ivor Costa, MD      . lisinopril (PRINIVIL,ZESTRIL) tablet 10 mg  10 mg Oral Daily Ivor Costa, MD   10 mg at 10/07/17 6015   And  . hydrochlorothiazide (MICROZIDE) capsule 12.5 mg  12.5 mg Oral Daily Ivor Costa, MD   12.5 mg at 10/07/17 0944  . lidocaine (LIDODERM) 5 % 1 patch  1 patch Transdermal  Q24H Annita Brod, MD   1 patch at 10/07/17 0947  . methocarbamol (ROBAXIN) 500 mg in dextrose 5 % 50 mL IVPB  500 mg Intravenous Q6H PRN Annita Brod, MD      . metoprolol succinate (TOPROL-XL) 24 hr tablet 25 mg  25 mg Oral Daily Ivor Costa, MD   25 mg at 10/07/17 0943  . morphine 4 MG/ML injection 3 mg  3 mg Intravenous Q2H PRN Annita Brod, MD   3 mg at 10/07/17 0944  . multivitamin with minerals tablet 1 tablet  1 tablet Oral Daily Ivor Costa, MD   1 tablet at 10/07/17 0944  . ondansetron (ZOFRAN) injection 4 mg  4 mg Intravenous Q8H PRN Ivor Costa, MD      . pantoprazole (PROTONIX) EC tablet 40 mg  40 mg Oral Daily Ivor Costa, MD   40 mg at 10/07/17 0944  . polyethylene glycol (MIRALAX / GLYCOLAX) packet 17 g  17 g Oral Daily PRN Ivor Costa, MD      . senna-docusate (Senokot-S) tablet 2 tablet  2 tablet Oral BID Ivor Costa, MD   2 tablet at 10/07/17 0944  . zolpidem (AMBIEN) tablet 5 mg  5 mg Oral QHS PRN Ivor Costa, MD         Discharge Medications: Please see discharge summary for a list of discharge medications.  Relevant Imaging Results:  Relevant Lab Results:   Additional Information SS#[id number]  [place] Witz A Shadrack Brummitt, LCSW

## 2017-10-07 NOTE — Progress Notes (Signed)
Spoke with Debbie from MRI to follow up on patient's pending MRI procedure

## 2017-10-07 NOTE — Progress Notes (Signed)
Subjective:  The patient is a bit somnolent.  She looks and feels better  Since starting the Neurontin.  Her daughter and son-in-law are at the bedside.  Objective: Vital signs in last 24 hours: Temp:  [98 F (36.7 C)-99 F (37.2 C)] 98.2 F (36.8 C) (06/28 0420) Pulse Rate:  [70-74] 70 (06/28 0420) Resp:  [17-18] 17 (06/27 2012) BP: (116-128)/(46-53) 119/49 (06/28 0420) SpO2:  [96 %-100 %] 97 % (06/28 0420) Estimated body mass index is 53.23 kg/m as calculated from the following:   Height as of this encounter: 4\' 9"  (1.448 m).   Weight as of this encounter: 111.6 kg (246 lb).   Intake/Output from previous day: 06/27 0701 - 06/28 0700 In: 240 [P.O.:240] Out: 900 [Urine:900] Intake/Output this shift: Total I/O In: 240 [P.O.:240] Out: -   Physical exam   The patient is mildly somnolent but easily arousable.  She is oriented.  Her lower extremity strength is grossly normal.  Today her Faber's and straight leg raise testing are negative.  I reviewed the patient's lumbar MRI performed at [facility] today.  She has the expected  Postoperative changes status post her L3-4 fusion.  There is some mild stenosis and benign appearing postoperative fluid collections.  I do not see any obvious explanation for the patient's left leg pain.    I have also reviewed the patient's hip MRI report.  She has some  Soft tissue injury on the left.  Lab Results: Recent Labs    10/05/17 1810 10/06/17 0030  WBC 7.3 8.1  HGB 12.1 12.1  HCT 35.7 37.4  PLT 270 234   BMET Recent Labs    10/05/17 1810 10/06/17 0030  NA 138 137  K 3.9 4.2  CL 98 96*  CO2 28 30  GLUCOSE 133* 164*  BUN 15 13  CREATININE 0.67 0.71  CALCIUM 9.0 9.4    Studies/Results: Ct Lumbar Spine Wo Contrast  Result Date: 10/05/2017 CLINICAL DATA:  Recent surgery, followed by a more recent fall. Hip pain and back pain. EXAM: CT LUMBAR SPINE WITHOUT CONTRAST TECHNIQUE: Multidetector CT imaging of the lumbar spine  was performed without intravenous contrast administration. Multiplanar CT image reconstructions were also generated. COMPARISON:  Lumbar spine radiograph 09/16/2017, 08/19/2017 Lumbar spine MRI 05/20/2017 FINDINGS: Segmentation: There is transitional lumbosacral anatomy. Numbering is preserved from the prior studies, with the fused levels considered to be L3-L4. Alignment: Normal alignment. Vertebrae: Old superior endplate fracture of L3 is unchanged. There is PLIF hardware at L3-L4. There is lucency surrounding both L4 screws with irregularity of the lateral vertebral body cortex (series 4, image 101) on the left. Paraspinal and other soft tissues: There is calcific aortic atherosclerosis. Disc levels: There is no stenosis or large disc herniation of the disc levels above L2. At L2-L3, there is no spinal canal stenosis or neural foraminal stenosis. There is mild disc space narrowing and facet hypertrophy. No residual spinal canal stenosis at L4-L5. Neural foramina are patent. At L5-S1, there is mild bilateral foraminal narrowing. IMPRESSION: 1. L3-L4 PLIF with marked lucency surrounding both L4 transpedicular screws, with irregularity of the left lateral vertebral body cortex. With recent surgery and no available prior lumbar spine CT, the acuity of the findings is uncertain, but given the patient's recent fall, this is concerning for hardware loosening and fracture of the lateral vertebral body cortex. 2. No spinal canal stenosis. 3. Mild bilateral L5-S1 neural foraminal narrowing. 4.  Aortic Atherosclerosis (ICD10-I70.0). Electronically Signed   By: Lennette Bihari  [place] M.D.   On: 10/05/2017 19:22   Mr Lumbar Spine W Wo Contrast  Result Date: 10/07/2017 CLINICAL DATA:  Left leg pain predominantly located laterally after a recent fall. Numbness at times. Lumbar fusion on 09/14/2017. Concern for L4 screw loosening and vertebral body fracture on CT. EXAM: MRI LUMBAR SPINE WITHOUT AND WITH CONTRAST TECHNIQUE:  Multiplanar and multiecho pulse sequences of the lumbar spine were obtained without and with intravenous contrast. CONTRAST:  90mL MULTIHANCE GADOBENATE DIMEGLUMINE 529 MG/ML IV SOLN COMPARISON:  Lumbar spine CT 10/05/2017 and MRI 05/20/2017 FINDINGS: The study is intermittently up to moderately motion degraded. Segmentation: Transitional lumbosacral anatomy with partial sacralization of L5, the same numbering as previously used. Alignment: Exaggerated lumbar lordosis. Trace retrolisthesis of L2 on L3. Mild lumbar levoscoliosis. Vertebrae: L3-4 posterior and interbody fusion with susceptibility artifact limiting assessment of this region including for acute fracture. A chronic L3 superior endplate compression fracture is again noted. No suspicious osseous lesion is identified. Conus medullaris and cauda equina: Conus extends to the L1 level. There is a small T2 hyperintense subdural fluid collection ventrally at L1-2. There may be a small amount of dorsal subdural or epidural fluid at L4-5. Diffuse smooth dural enhancement is noted throughout the lumbar spine extending into the sacral canal as well as the included lower thoracic spine. Paraspinal and other soft tissues: Postoperative changes in the posterior lumbar soft tissues. 3.8 x 3.4 x 8.8 cm subcutaneous fluid collection from T12-L4. 4.0 x 2.6 x 2.6 cm dorsal epidural fluid collection in the laminectomy bed at L3. Disc levels: T11-12: Mild disc space narrowing. Mild disc bulging, small central disc protrusion, and mild facet hypertrophy without significant stenosis, unchanged from the prior MRI. T12-L1: Mild facet hypertrophy without disc herniation or stenosis, unchanged. L1-2: Effacement of the subarachnoid CSF by the subdural collection. Mild facet hypertrophy. No disc herniation or degenerative stenosis. L2-3: Interval laminectomies. Dorsal epidural fluid collection moderately narrows the thecal sac, greatest at the mid L3 vertebral body level. The neural  foramina are patent. L3-4: Interval laminectomies. Evaluation limited by motion artifact through this level on axial images. Diffuse epidural enhancement which may represent postoperative granulation tissue. Grossly patent neural foramina. L4-5: Limited assessment due to motion and susceptibility artifact. Partial effacement of the thecal sac by dural/epidural soft tissue thickening and possible small fluid collection. Poorly evaluated neural foramina. L5-S1: Interbody ankylosis. Prior right laminectomy. No significant stenosis. IMPRESSION: 1. Motion degraded examination with recent postoperative changes as above. Dorsal epidural collection at L3 moderately narrows the spinal canal. 2. Small subdural fluid collection and diffuse dural enhancement may be postoperative. Correlate for any clinical or laboratory evidence of infection. Electronically Signed   By: Logan Bores M.D.   On: 10/07/2017 08:47   Mr Hip Left W Wo Contrast  Result Date: 10/07/2017 CLINICAL DATA:  Left leg pain after fall on Sunday. Recent lumbar spine surgery. EXAM: MRI OF THE LEFT HIP WITHOUT AND WITH CONTRAST TECHNIQUE: Multiplanar, multisequence MR imaging was performed both before and after administration of intravenous contrast. CONTRAST:  27mL MULTIHANCE GADOBENATE DIMEGLUMINE 529 MG/ML IV SOLN COMPARISON:  CT left hip dated October 04, 2017. FINDINGS: Bones: There is no evidence of acute fracture, dislocation or avascular necrosis. Mild degenerative changes of the pubic symphysis and left sacroiliac joint. Articular cartilage and labrum Articular cartilage: No focal chondral defect or subchondral signal abnormality identified. Labrum: There is no gross labral tear or paralabral abnormality. Joint or bursal effusion Joint effusion: No significant hip joint effusion. Bursae:  No periarticular fluid collection. Muscles and tendons Muscles and tendons: Low-grade partial tear of the left hamstring tendon origin. Fluid along the left gluteus  maximus myotendinous junction, consistent with strain and low-grade partial tear. Remaining bilateral gluteal tendons are unremarkable. The right hamstring and bilateral iliopsoas tendons are intact. Mild bilateral piriformis muscle atrophy. Diffuse fatty atrophy of the right tensor fascia lata. Other findings Miscellaneous: Mild superficial soft tissue swelling over the lateral hip. No soft tissue mass. Prior hysterectomy. The visualized internal pelvic contents otherwise appear unremarkable. IMPRESSION: 1.  No acute osseous abnormality. 2. Strain and low-grade partial tear of the left gluteus maximus myotendinous junction. 3. Low-grade partial tear of the left hamstring tendon origin. Electronically Signed   By: Titus Dubin M.D.   On: 10/07/2017 10:01   Dg Femur Min 2 Views Left  Result Date: 10/05/2017 CLINICAL DATA:  Patient fell Sunday and now has left hip pain. EXAM: LEFT FEMUR 2 VIEWS COMPARISON:  None. FINDINGS: Suspicious subcapital left femoral neck lucency raises concern for a nondisplaced fracture. CT of the left hip is recommended for better assessment. Osteoarthritic spurring of the left acetabular roof is noted. The adjacent left pubic rami appear intact. No acute abnormality of the adjacent left SI joint. The left femoral shaft and femoral condyles are unremarkable. There is mild joint space narrowing of the included knee. No joint effusion is visualized. IMPRESSION: Suspicious lucency at the base of the left femoral head raising concern for possible nondisplaced subcapital left femoral neck fracture. CT is therefore recommended for better correlation. Electronically Signed   By: Ashley Royalty M.D.   On: 10/05/2017 19:37    Assessment/Plan:  left hip and leg pain status post fall.  I have discussed the situation with the patient and her family.  Her CT scan demonstrates some possible loosening of the screws status post her fall.  I do not see any significant neural compression.  I do not  think there is anything in do about this for now this will likely improve with time.    Summer pain is likely coming from the soft tissue injury to her left hip.  I think she will need physical therapy and transferred to rehab or skilled nursing facility.  She does not need back surgery now.  I have answered all their questions.  LOS: 2 days     Ophelia Charter 10/07/2017, 4:32 PM

## 2017-10-08 DIAGNOSIS — W19XXXS Unspecified fall, sequela: Secondary | ICD-10-CM

## 2017-10-08 MED ORDER — HYDROCODONE-ACETAMINOPHEN 5-325 MG PO TABS
1.0000 | ORAL_TABLET | ORAL | Status: DC | PRN
  Administered 2017-10-08 – 2017-10-09 (×3): 1 via ORAL
  Filled 2017-10-08 (×3): qty 1

## 2017-10-08 MED ORDER — ALPRAZOLAM 0.25 MG PO TABS: 0.2500 mg | ORAL_TABLET | Freq: Two times a day (BID) | ORAL | Status: DC | PRN

## 2017-10-08 MED ORDER — TRAMADOL HCL 50 MG PO TABS
50.0000 mg | ORAL_TABLET | Freq: Four times a day (QID) | ORAL | Status: DC | PRN
  Administered 2017-10-08 – 2017-10-13 (×4): 50 mg via ORAL
  Filled 2017-10-08 (×4): qty 1

## 2017-10-08 MED ORDER — MORPHINE SULFATE (PF) 4 MG/ML IV SOLN
3.0000 mg | INTRAVENOUS | Status: DC | PRN
  Administered 2017-10-08 – 2017-10-11 (×10): 3 mg via INTRAVENOUS
  Filled 2017-10-08 (×10): qty 1

## 2017-10-08 MED ORDER — OXYCODONE HCL 5 MG PO TABS: 5.0000 mg | ORAL_TABLET | ORAL | Status: DC | PRN

## 2017-10-08 NOTE — Progress Notes (Signed)
PROGRESS NOTE  Michele Mcgee YTK:[id number] DOB: 1945-10-31 DOA: 10/05/2017 PCP: Kirk Ruths, MD  HPI/Recap of past 38 hours: 72 year old female with past medical history of hypertension, CAD and recent lumbar fusion at L3-4 weeks ago secondary to lumbar spinal stenosis and radiculopathy who was at rehab and then had a fall landing on her left side.  Started having severe pain in her left upper thigh down to her foot most on the lateral side 10/10.  Unable to stand.  Seen on 6/25 with a negative CT of the hip, but pain persisted so she was reevaluated in the emergency room the night of 6/26 at [facility].  CT of the lumbar spine was done showing questionable hardware loosening and fracture of the lateral vertebral body cortex.  Emergency room physician spoke with neurosurgery who advised patient to be transferred over to [facility]  Patient was admitted and is currently stable.  Seen by neurosurgery who is ordered an MRI of the hip and spine.    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  Patient's MRI of left hip notes no evidence of hip fracture, but does note strain and low-grade partial tear of left gluteus maximus myotendinous junction and of the left hamstring.  Case discussed with Orthopedic surgery who recommended pain control and physical therapy.  Neuro surgery followed up  And after reviewing MRI, no evidence of neuro compression.  They too are recommending physical therapy.    Today, patient complains of severe pain, specifically in the left hip as well as the back of her leg going all the way down to her ankle.  It is quite severe, 10/10  Assessment/Plan: Principal Problem:   Fall with partial tears of left gluteus maximus myotendinous junction and left hamstring: Discussed with orthopedic surgery PA who will review  films and discussed with orthopedic attending as to best treatment options.  In the meantime, patient having significant uncontrolled pain in that left side even with any type of movement. At this point, she is not even able to move her leg little on stand:  Have started pain regimen.  Adding p.o. As first-line treatment with IV as backup as patient skilled nursing facility will not accept her while she still getting IV pain medication.  She tolerated lidocaine patch placed yesterday, but it was removed yesterday evening when it was burning although she is feels like that might have been the MRI.  She is willing to retry the lidocaine patch again now.  I suspect that she also has sciatica from her description likely from prolonged immobilization.  Will plan to treat the acute left hip pain.  Physical therapy to see.  Active Problems:   Coronary artery disease: Stable   Depression: Stable, no suicidal ideations.  Continue home medications   Essential hypertension: Continue antihypertensive medications   Morbid obesity [facility]): Patient meets criteria BMI greater than 40. Recent lumbar fusion:   Neuro surgery notes no need for neurosurgical intervention.  After fall  sciatica:  From prolonged immobilization.  Once patient is more ambulatory, this will resolve   Code Status: Full code  Family Communication:  Left message for daughter  Disposition Plan: Eventually return back to skilled nursing, likely Monday   Consultants:  Dr. Miguel Rota   case discussed with Hilbert Odor, ortho PA and Dr. Delfino Lovett, orthopedic surgery  Procedures:  None  Antimicrobials:  None  DVT prophylaxis: Subcu heparin   Objective: Vitals:   10/07/17 1747 10/07/17 2023 10/08/17 0431 10/08/17 1357  BP: (!) 120/39 (!) 130/56 129/77 139/61  Pulse: 76 71 73 72  Resp: 18 17 16 17   Temp: 98 F (36.7 C) 97.9 F (36.6 C) 97.9 F (36.6 C) 98 F (36.7 C)  TempSrc: Oral Oral Oral Oral  SpO2: 95% 93%  96% 93%  Weight:      Height:        Intake/Output Summary (Last 24 hours) at 10/08/2017 1641 Last data filed at 10/08/2017 1634 Gross per 24 hour  Intake 1611.5 ml  Output 800 ml  Net 811.5 ml   Vitals:   10/08/17 0431 10/08/17 1357  BP: 129/77 139/61  Pulse: 73 72  Resp: 16 17  Temp: 97.9 F (36.6 C) 98 F (36.7 C)  SpO2: 96% 93%    Exam:   General: Alert and oriented x3, moderate distress secondary to pain  HEENT: Normocephalic and atraumatic, mucous memories are slightly dry  Cardiovascular: Regular rate and rhythm, S1-S2  Respiratory: Clear to auscultation bilaterally  Abdomen: Soft, nontender, nondistended, hypoactive bowel sounds  Musculoskeletal: Trace pitting edema.    Skin: No skin breaks, tears or lesions  Psychiatry: Appropriate, no evidence of psychoses   Data Reviewed: CBC: Recent Labs  Lab 10/05/17 1810 10/06/17 0030  WBC 7.3 8.1  HGB 12.1 12.1  HCT 35.7 37.4  MCV 91.4 94.0  PLT 270 536   Basic Metabolic Panel: Recent Labs  Lab 10/05/17 1810 10/06/17 0030  NA 138 137  K 3.9 4.2  CL 98 96*  CO2 28 30  GLUCOSE 133* 164*  BUN 15 13  CREATININE 0.67 0.71  CALCIUM 9.0 9.4   GFR: Estimated Creatinine Clearance: 68 mL/min (by C-G formula based on SCr of 0.71 mg/dL). Liver Function Tests: No results for input(s): AST, ALT, ALKPHOS, BILITOT, PROT, ALBUMIN in the last 168 hours. No results for input(s): LIPASE, AMYLASE in the last 168 hours. No results for input(s): AMMONIA in the last 168 hours. Coagulation Profile: Recent Labs  Lab 10/06/17 0030  INR 1.09   Cardiac Enzymes: No results for input(s): CKTOTAL, CKMB, CKMBINDEX, TROPONINI in the last 168 hours. BNP (last 3 results) No results for input(s): PROBNP in the last 8760 hours. HbA1C: No results for input(s): HGBA1C in the last 72 hours. CBG: No results for input(s): GLUCAP in the last 168 hours. Lipid Profile: No results for input(s): CHOL, HDL, LDLCALC, TRIG,  CHOLHDL, LDLDIRECT in the last 72 hours. Thyroid Function Tests: No results for input(s): TSH, T4TOTAL, FREET4, T3FREE, THYROIDAB in the last 72 hours. Anemia Panel: No results for input(s): VITAMINB12, FOLATE, FERRITIN, TIBC, IRON, RETICCTPCT in the last 72 hours. Urine analysis: No results found for: COLORURINE, APPEARANCEUR, LABSPEC, PHURINE, GLUCOSEU, HGBUR, BILIRUBINUR, KETONESUR, PROTEINUR, UROBILINOGEN, NITRITE, LEUKOCYTESUR Sepsis Labs: @LABRCNTIP (procalcitonin:4,lacticidven:4)  )No results found for this or any previous visit (from the past 240 hour(s)).    Studies: No results found.  Scheduled Meds: . aspirin  81 mg Oral Daily  . atorvastatin  40 mg Oral QPM  . calcium-vitamin D  1 tablet Oral BID  . escitalopram  20 mg Oral Daily  . gabapentin  100 mg Oral BID  . heparin  5,000 Units Subcutaneous Q8H  . lisinopril  10 mg Oral Daily   And  . hydrochlorothiazide  12.5 mg Oral Daily  . lidocaine  1 patch Transdermal Q24H  . metoprolol  succinate  25 mg Oral Daily  . multivitamin with minerals  1 tablet Oral Daily  . pantoprazole  40 mg Oral Daily  . senna-docusate  2 tablet Oral BID    Continuous Infusions: . sodium chloride 50 mL/hr at 10/08/17 1635  . methocarbamol (ROBAXIN)  IV 500 mg (10/08/17 1521)     LOS: 3 days     Annita Brod, MD [facility]  To reach me or the doctor on call, go to: [email or web address] Password [facility]  10/08/2017, 4:41 PM

## 2017-10-08 NOTE — Progress Notes (Signed)
PT Cancellation Note  Patient Details Name: Michele Mcgee MRN: [id number] DOB: Dec 13, 1945   Cancelled Treatment:    Reason Eval/Treat Not Completed: Active bedrest order. PT will continue to follow acutely and await updated activity orders.   Mariano Colon 10/08/2017, 1:51 PM

## 2017-10-08 NOTE — Clinical Social Work Note (Addendum)
Facility did not get auth on Friday and Facility states IV Morphine has to be d/c.  [place], [place]

## 2017-10-08 NOTE — Plan of Care (Signed)
  Problem: Education: Goal: Knowledge of General Education information will improve Outcome: Progressing   Problem: Nutrition: Goal: Adequate nutrition will be maintained Outcome: Progressing   Problem: Coping: Goal: Level of anxiety will decrease Outcome: Progressing   Problem: Safety: Goal: Ability to remain free from injury will improve Outcome: Progressing   Problem: Skin Integrity: Goal: Risk for impaired skin integrity will decrease Outcome: Progressing

## 2017-10-09 DIAGNOSIS — F411 Generalized anxiety disorder: Secondary | ICD-10-CM

## 2017-10-09 MED ORDER — ALPRAZOLAM 0.25 MG PO TABS
0.2500 mg | ORAL_TABLET | Freq: Two times a day (BID) | ORAL | Status: DC
  Administered 2017-10-09 – 2017-10-14 (×10): 0.25 mg via ORAL
  Filled 2017-10-09 (×10): qty 1

## 2017-10-09 MED ORDER — ALPRAZOLAM 0.25 MG PO TABS
0.2500 mg | ORAL_TABLET | ORAL | Status: AC
  Administered 2017-10-09: 0.25 mg via ORAL
  Filled 2017-10-09: qty 1

## 2017-10-09 MED ORDER — ALUM & MAG HYDROXIDE-SIMETH 200-200-20 MG/5ML PO SUSP
30.0000 mL | Freq: Once | ORAL | Status: AC
  Administered 2017-10-09: 30 mL via ORAL
  Filled 2017-10-09: qty 30

## 2017-10-09 MED ORDER — POLYETHYLENE GLYCOL 3350 17 G PO PACK
17.0000 g | PACK | Freq: Once | ORAL | Status: AC
  Administered 2017-10-09: 17 g via ORAL
  Filled 2017-10-09: qty 1

## 2017-10-09 MED ORDER — HYDROCODONE-ACETAMINOPHEN 10-325 MG PO TABS
1.0000 | ORAL_TABLET | ORAL | Status: DC | PRN
  Administered 2017-10-09 – 2017-10-14 (×10): 1 via ORAL
  Filled 2017-10-09 (×10): qty 1

## 2017-10-09 NOTE — Progress Notes (Signed)
PROGRESS NOTE  Michele Mcgee EUM:[id number] DOB: Nov 23, 1945 DOA: 10/05/2017 PCP: Kirk Ruths, MD  HPI/Recap of past 37 hours: 72 year old female with past medical history of hypertension, CAD and recent lumbar fusion at L3-4 weeks ago secondary to lumbar spinal stenosis and radiculopathy who was at rehab and then had a fall landing on her left side.  Started having severe pain in her left upper thigh down to her foot most on the lateral side 10/10.  Unable to stand.  Seen on 6/25 with a negative CT of the hip, but pain persisted so she was reevaluated in the emergency room the night of 6/26 at [facility].  CT of the lumbar spine was done showing questionable hardware loosening and fracture of the lateral vertebral body cortex.  Emergency room physician spoke with neurosurgery who advised patient to be transferred over to [facility].  Patient was admitted and is currently stable.  Seen by neurosurgery who is ordered an MRI of the hip and spine.    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  Patient's MRI of left hip notes no evidence of hip fracture, but does note strain and low-grade partial tear of left gluteus maximus myotendinous junction and of the left hamstring.  Case discussed with Orthopedic surgery who recommended pain control and physical therapy.  Neuro surgery followed up  And after reviewing MRI, no evidence of neuro compression.  They too are recommending physical therapy.  Patient had a pretty good night and slept well.  This morning, was able to tolerate sitting in a chair for approximately 90 minutes.  She had some pain in getting back to bed which made her severely anxious and currently she is tearful about that.  She complains of pain mostly in her left foot, but leg pain much improved with lidocaine patch.  She still  requiring a fair amount of IV morphine for breakthrough pain and she has not moved her bowels in 1 week.  Assessment/Plan: Principal Problem:   Fall with partial tears of left gluteus maximus myotendinous junction and left hamstring: Discussed with orthopedic surgery PA who will review films and discussed with orthopedic attending as to best treatment options.  In the meantime, patient having significant uncontrolled pain in that left side even with any type of movement.  She is starting to respond to pain regimen treatment.  Will increase strength of Norco and hopefully this will limit how much IV morphine she needs.  Anxiety may be playing a role in this.  See below.  Continue lidocaine patch.  Continue muscle relaxer.  Showing signs of improvement as she is now able to sit in a chair.   Active Problems:   Coronary artery disease: Stable Anxiety/depression: Stable, no suicidal ideations.  Continue home medications.  Patient gets severely anxious and distressed.  I have increased her Xanax to 0.25 p.o. twice daily and will change this to more scheduled dosing.  I think this will greatly help her.    Essential hypertension: Continue antihypertensive medications    Morbid obesity [facility]): Patient meets criteria BMI greater than 40.  Recent lumbar fusion:   Neuro surgery notes no need for neurosurgical intervention after her fall.  Constipation: In part from dehydration as well as from opiate medications.  Patient has not had a bowel movement in about a week.  She feels like at times she has to go, but she is fearful of trying to move.  Will start with some scheduled MiraLAX. I suspect this is affecting her appetite   sciatica:  From prolonged immobilization.  Some relief with lidocaine patch.  Once patient is more ambulatory, this will continue to improve.   Code Status: Full code  Family Communication:  Daughter at the bedside  Disposition Plan: Eventually return back to skilled nursing,  possible Monday or Tues   Consultants:  Dr. Miguel Rota   case discussed with Hilbert Odor, ortho PA and Dr. Delfino Lovett, orthopedic surgery  Procedures:  None  Antimicrobials:  None  DVT prophylaxis: Subcu heparin   Objective: Vitals:   10/08/17 0431 10/08/17 1357 10/08/17 2038 10/09/17 0454  BP: 129/77 139/61 133/71 130/65  Pulse: 73 72 76 84  Resp: 16 17 18 18   Temp: 97.9 F (36.6 C) 98 F (36.7 C) 98.6 F (37 C) 98.3 F (36.8 C)  TempSrc: Oral Oral Oral Oral  SpO2: 96% 93% 97% 95%  Weight:      Height:        Intake/Output Summary (Last 24 hours) at 10/09/2017 1257 Last data filed at 10/09/2017 0900 Gross per 24 hour  Intake 2093.74 ml  Output 400 ml  Net 1693.74 ml   Vitals:   10/08/17 2038 10/09/17 0454  BP: 133/71 130/65  Pulse: 76 84  Resp: 18 18  Temp: 98.6 F (37 C) 98.3 F (36.8 C)  SpO2: 97% 95%    Exam:   General: Alert and oriented x3, tearful & anxious  HEENT: Normocephalic and atraumatic, mucous memories are slightly dry  Cardiovascular: Regular rate and rhythm, S1-S2  Respiratory: Clear to auscultation bilaterally  Abdomen: Soft, nontender, nondistended, hypoactive bowel sounds  Musculoskeletal: Trace pitting edema.    Skin: No skin breaks, tears or lesions  Psychiatry: Appropriate, no evidence of psychoses  Neuro:No focal deficits   Data Reviewed: CBC: Recent Labs  Lab 10/05/17 1810 10/06/17 0030  WBC 7.3 8.1  HGB 12.1 12.1  HCT 35.7 37.4  MCV 91.4 94.0  PLT 270 062   Basic Metabolic Panel: Recent Labs  Lab 10/05/17 1810 10/06/17 0030  NA 138 137  K 3.9 4.2  CL 98 96*  CO2 28 30  GLUCOSE 133* 164*  BUN 15 13  CREATININE 0.67 0.71  CALCIUM 9.0 9.4   GFR: Estimated Creatinine Clearance: 68 mL/min (by C-G formula based on SCr of 0.71 mg/dL). Liver Function Tests: No results for input(s): AST, ALT, ALKPHOS, BILITOT, PROT, ALBUMIN in the last 168 hours. No results for input(s): LIPASE,  AMYLASE in the last 168 hours. No results for input(s): AMMONIA in the last 168 hours. Coagulation Profile: Recent Labs  Lab 10/06/17 0030  INR 1.09   Cardiac Enzymes: No results for input(s): CKTOTAL, CKMB, CKMBINDEX, TROPONINI in the last 168 hours. BNP (last 3 results) No results for input(s): PROBNP in the last 8760 hours. HbA1C: No results for input(s): HGBA1C in the last 72 hours. CBG: No results for input(s): GLUCAP in the last 168 hours. Lipid Profile: No results for input(s): CHOL, HDL, LDLCALC, TRIG, CHOLHDL, LDLDIRECT in the last 72 hours. Thyroid Function Tests: No results for input(s): TSH, T4TOTAL, FREET4, T3FREE, THYROIDAB in the last 72 hours. Anemia Panel: No results for input(s): VITAMINB12, FOLATE, FERRITIN, TIBC, IRON, RETICCTPCT in the last 72 hours. Urine analysis: No results found for: COLORURINE, APPEARANCEUR, LABSPEC, PHURINE, GLUCOSEU, HGBUR, BILIRUBINUR, KETONESUR, PROTEINUR, UROBILINOGEN, NITRITE, LEUKOCYTESUR Sepsis Labs: @LABRCNTIP (procalcitonin:4,lacticidven:4)  )  No results found for this or any previous visit (from the past 240 hour(s)).    Studies: No results found.  Scheduled Meds: . ALPRAZolam  0.25 mg Oral BID  . aspirin  81 mg Oral Daily  . atorvastatin  40 mg Oral QPM  . calcium-vitamin D  1 tablet Oral BID  . escitalopram  20 mg Oral Daily  . gabapentin  100 mg Oral BID  . heparin  5,000 Units Subcutaneous Q8H  . lisinopril  10 mg Oral Daily   And  . hydrochlorothiazide  12.5 mg Oral Daily  . lidocaine  1 patch Transdermal Q24H  . metoprolol succinate  25 mg Oral Daily  . multivitamin with minerals  1 tablet Oral Daily  . pantoprazole  40 mg Oral Daily  . polyethylene glycol  17 g Oral Once  . senna-docusate  2 tablet Oral BID    Continuous Infusions: . sodium chloride 10 mL/hr at 10/09/17 1214  . methocarbamol (ROBAXIN)  IV 500 mg (10/09/17 1234)     LOS: 4 days     Annita Brod, MD [facility]  To  reach me or the doctor on call, go to: [email or web address] Password [facility]  10/09/2017, 12:57 PM

## 2017-10-09 NOTE — Plan of Care (Signed)

## 2017-10-09 NOTE — Evaluation (Signed)
Physical Therapy Evaluation Patient Details Name: Michele Mcgee MRN: [id number] DOB: [birth year] Today's Date: 10/09/2017   History of Present Illness  72 year old female with past medical history of hypertension, CAD and recent lumbar fusion at L3-4 weeks ago secondary to lumbar spinal stenosis and radiculopathy who was at rehab and then had a fall landing on her left side.  MRI showed partial tears of left gluteus maximus myotendinous junction and left hamstring  Clinical Impression  Pt admitted with above diagnosis. Pt currently with functional limitations due to the deficits listed below (see PT Problem List). Pt fearful of moving but tolerated mobility well and reported decreased pain once she was up. Max A +2 for bed mobility and min A +2 for tranfers and stepping to chair. Pt could not tolerate elevation of leg rest of chair due to discomfort L HS, LE's remained down for comfort. Discussed return to bed with nursing after session. Expect that pt will need SNF level care at d/c.  Pt will benefit from skilled PT to increase their independence and safety with mobility to allow discharge to the venue listed below.       Follow Up Recommendations SNF;Supervision/Assistance - 24 hour    Equipment Recommendations  None recommended by PT    Recommendations for Other Services       Precautions / Restrictions Precautions Precautions: Fall;Back Precaution Booklet Issued: No Precaution Comments: Verbally reviewed BLT back precautions.  Restrictions Weight Bearing Restrictions: No      Mobility  Bed Mobility Overal bed mobility: Needs Assistance Bed Mobility: Rolling;Sidelying to Sit Rolling: Mod assist;+2 for physical assistance Sidelying to sit: Max assist;+2 for physical assistance       General bed mobility comments: pt followed instructions to reach for L rail and was able to assist with roll this way. Max A for LE's off bed as well as to elevate trunk into sitting. Max A for  scooting hips to EOB to gain balance  Transfers Overall transfer level: Needs assistance Equipment used: Rolling walker (2 wheeled) Transfers: Sit to/from Stand Sit to Stand: Min assist;+2 safety/equipment Stand pivot transfers: Min assist;+2 physical assistance       General transfer comment: bed slightly elevated. Min A for power up and stability. vc's for stepping feet and moving RW to chair. Min A for support and balance  Ambulation/Gait             General Gait Details: pivot steps to chair only, pt dizzy  Stairs            Wheelchair Mobility    Modified Rankin (Stroke Patients Only)       Balance Overall balance assessment: Needs assistance Sitting-balance support: Feet supported;Bilateral upper extremity supported Sitting balance-Leahy Scale: Poor Sitting balance - Comments: had difficulty maintaining balance EOB, min A needed at first, then pt able to maintain with both hands on bed and supervision Postural control: Posterior lean Standing balance support: Bilateral upper extremity supported Standing balance-Leahy Scale: Poor Standing balance comment: heavy reliance on RW for UE support                             Pertinent Vitals/Pain Pain Assessment: Faces Faces Pain Scale: Hurts even more Pain Location: L buttocks down to ankle Pain Descriptors / Indicators: Discomfort;Guarding Pain Intervention(s): Limited activity within patient's tolerance;Monitored during session    Home Living Family/patient expects to be discharged to:: Private residence Living Arrangements: Alone Available Help at  Discharge: Family;Available 24 hours/day Type of Home: House Home Access: Stairs to enter Entrance Stairs-Rails: Right;Left;Can reach both Entrance Stairs-Number of Steps: 2 Home Layout: One level Home Equipment: Walker - 2 wheels;Cane - single point;Shower seat;Grab bars - tub/shower Additional Comments: pt has been at [facility] since  back surgery, expect that she will need higher level care after this hospitalization as daughter mentions that she is expected to bathe and dress herself at [facility]    Prior Function Level of Independence: Needs assistance   Gait / Transfers Assistance Needed: Was ambulating with RW at ALF  ADL's / Homemaking Assistance Needed: needed assist        Hand Dominance   Dominant Hand: Right    Extremity/Trunk Assessment   Upper Extremity Assessment Upper Extremity Assessment: Defer to OT evaluation    Lower Extremity Assessment Lower Extremity Assessment: Generalized weakness LLE Deficits / Details: as expected, pain with L knee flexion and L hip extension as well as stretch to L HS (cannot handle sitting up with L knee extended)    Cervical / Trunk Assessment Cervical / Trunk Assessment: Kyphotic  Communication   Communication: No difficulties  Cognition Arousal/Alertness: Awake/alert Behavior During Therapy: WFL for tasks assessed/performed Overall Cognitive Status: Within Functional Limits for tasks assessed                                        General Comments General comments (skin integrity, edema, etc.): educated pt on function of gluteus maximus and HS so she would understand why she feels pain when she does. Pt needed vc's throughout for breathing, tends to hold breath and then get dizzy.     Exercises     Assessment/Plan    PT Assessment Patient needs continued PT services  PT Problem List Decreased strength;Decreased range of motion;Decreased activity tolerance;Decreased balance;Decreased mobility;Decreased knowledge of use of DME;Decreased safety awareness;Decreased knowledge of precautions;Pain       PT Treatment Interventions DME instruction;Gait training;Functional mobility training;Therapeutic activities;Therapeutic exercise;Neuromuscular re-education;Patient/family education    PT Goals (Current goals can be found in the Care  Plan section)  Acute Rehab PT Goals Patient Stated Goal: to get better PT Goal Formulation: With patient Time For Goal Achievement: 10/23/17 Potential to Achieve Goals: Good    Frequency Min 3X/week   Barriers to discharge Decreased caregiver support      Co-evaluation               AM-PAC PT "6 Clicks" Daily Activity  Outcome Measure Difficulty turning over in bed (including adjusting bedclothes, sheets and blankets)?: Unable Difficulty moving from lying on back to sitting on the side of the bed? : Unable Difficulty sitting down on and standing up from a chair with arms (e.g., wheelchair, bedside commode, etc,.)?: Unable Help needed moving to and from a bed to chair (including a wheelchair)?: A Lot Help needed walking in hospital room?: A Lot Help needed climbing 3-5 steps with a railing? : Total 6 Click Score: 8    End of Session Equipment Utilized During Treatment: Gait belt Activity Tolerance: Patient tolerated treatment well Patient left: in chair;with call bell/phone within reach;with family/visitor present Nurse Communication: Mobility status PT Visit Diagnosis: Unsteadiness on feet (R26.81);Pain;Other symptoms and signs involving the nervous system (R29.898) Pain - Right/Left: Left Pain - part of body: Leg    Time: 7353-2992 PT Time Calculation (min) (ACUTE ONLY): 35  min   Charges:   PT Evaluation $PT Eval Moderate Complexity: 1 Mod PT Treatments $Therapeutic Activity: 8-22 mins   PT G Codes:        Bernice  [place] 10/09/2017, 10:54 AM

## 2017-10-10 DIAGNOSIS — K5903 Drug induced constipation: Secondary | ICD-10-CM

## 2017-10-10 LAB — CBC
HEMATOCRIT: 38.4 % (ref 36.0–46.0)
Hemoglobin: 12.6 g/dL (ref 12.0–15.0)
MCH: 30.2 pg (ref 26.0–34.0)
MCHC: 32.8 g/dL (ref 30.0–36.0)
MCV: 92.1 fL (ref 78.0–100.0)
PLATELETS: 220 10*3/uL (ref 150–400)
RBC: 4.17 MIL/uL (ref 3.87–5.11)
RDW: 13.1 % (ref 11.5–15.5)
WBC: 6.5 10*3/uL (ref 4.0–10.5)

## 2017-10-10 LAB — BASIC METABOLIC PANEL
Anion gap: 7 (ref 5–15)
BUN: 15 mg/dL (ref 8–23)
CO2: 28 mmol/L (ref 22–32)
CREATININE: 0.72 mg/dL (ref 0.44–1.00)
Calcium: 8.9 mg/dL (ref 8.9–10.3)
Chloride: 100 mmol/L (ref 98–111)
GFR calc Af Amer: >60 mL/min (ref >60)
Glucose, Bld: 112 mg/dL — ABNORMAL HIGH (ref 70–99)
POTASSIUM: 3.6 mmol/L (ref 3.5–5.1)
SODIUM: 135 mmol/L (ref 135–145)

## 2017-10-10 MED ORDER — LIDOCAINE 5 % EX PTCH
1.0000 | MEDICATED_PATCH | CUTANEOUS | Status: DC
  Administered 2017-10-10 – 2017-10-11 (×2): 1 via TRANSDERMAL
  Filled 2017-10-10 (×3): qty 1

## 2017-10-10 MED ORDER — LACTULOSE 10 GM/15ML PO SOLN
10.0000 g | Freq: Once | ORAL | Status: AC
  Administered 2017-10-10: 10 g via ORAL
  Filled 2017-10-10: qty 15

## 2017-10-10 NOTE — Progress Notes (Signed)
Physical Therapy Treatment Patient Details Name: Michele Mcgee MRN: [id number] DOB: October 22, 1945 Today's Date: 10/10/2017    History of Present Illness 72 year old female with past medical history of hypertension, CAD and recent lumbar fusion at L3-4 weeks ago secondary to lumbar spinal stenosis and radiculopathy who was at rehab and then had a fall landing on her left side.  MRI showed partial tears of left gluteus maximus myotendinous junction and left hamstring    PT Comments    Pt very anxious today, resistant to all mobility, unable to attempt ambulation due to pain. Used stedy to stand and pivot to chair and pt was able tolerate standing for 2 bouts of 3 mins and reported some LLE symptom relief. PT will continue to follow.    Follow Up Recommendations  SNF;Supervision/Assistance - 24 hour     Equipment Recommendations  None recommended by PT    Recommendations for Other Services       Precautions / Restrictions Precautions Precautions: Fall;Back Precaution Booklet Issued: No Required Braces or Orthoses: Spinal Brace(per pt brace is at home) Spinal Brace: Lumbar corset;Applied in sitting position Restrictions Weight Bearing Restrictions: No    Mobility  Bed Mobility Overal bed mobility: Needs Assistance Bed Mobility: Rolling;Sidelying to Sit Rolling: Mod assist;+2 for physical assistance Sidelying to sit: Max assist;+2 for physical assistance       General bed mobility comments: pt very anxious with amm mobility today, resisting some assistance to get up but ultimately got to EOB  Transfers Overall transfer level: Needs assistance Equipment used: Ambulation equipment used Transfers: Sit to/from Stand;Stand Pivot Transfers Sit to Stand: +2 safety/equipment;Mod assist Stand pivot transfers: +2 physical assistance;Max assist       General transfer comment: pt had difficulty getting back to bed yesterday after session so practiced with stedy today to see if pt  could tolerate for use with nursing staff. Pt needed mod A +2 for power up for sit to stand.  Ambulation/Gait             General Gait Details: pt reported too much pain to attempt ambulation today   Stairs             Wheelchair Mobility    Modified Rankin (Stroke Patients Only)       Balance Overall balance assessment: Needs assistance Sitting-balance support: Feet supported;Bilateral upper extremity supported Sitting balance-Leahy Scale: Poor Sitting balance - Comments: posterior lean Postural control: Posterior lean Standing balance support: Bilateral upper extremity supported Standing balance-Leahy Scale: Poor Standing balance comment: heavy reliance on UE support. Pt able to maintain standing in stedy for 2 bouts of 3 mins. Pt reported LLE pain relief in standing but had some R hip pain while up                            Cognition Arousal/Alertness: Awake/alert Behavior During Therapy: Anxious;Agitated Overall Cognitive Status: Within Functional Limits for tasks assessed                                 General Comments: slow processing;requiring multiple repetitions of instructions      Exercises      General Comments General comments (skin integrity, edema, etc.): pt resistant to mobility due to pain, discussed need for mobility to get better, pt able to verbalize understanding but still resistant      Pertinent Vitals/Pain Pain Assessment: Faces  Faces Pain Scale: Hurts whole lot Pain Location: L buttocks down to ankle Pain Descriptors / Indicators: Discomfort;Guarding;Crying Pain Intervention(s): Limited activity within patient's tolerance;Monitored during session;Patient requesting pain meds-RN notified;RN gave pain meds during session    Home Living                      Prior Function            PT Goals (current goals can now be found in the care plan section) Acute Rehab PT Goals Patient Stated Goal:  to get better PT Goal Formulation: With patient Time For Goal Achievement: 10/23/17 Potential to Achieve Goals: Good Progress towards PT goals: Progressing toward goals    Frequency    Min 3X/week      PT Plan Current plan remains appropriate    Co-evaluation              AM-PAC PT "6 Clicks" Daily Activity  Outcome Measure  Difficulty turning over in bed (including adjusting bedclothes, sheets and blankets)?: Unable Difficulty moving from lying on back to sitting on the side of the bed? : Unable Difficulty sitting down on and standing up from a chair with arms (e.g., wheelchair, bedside commode, etc,.)?: Unable Help needed moving to and from a bed to chair (including a wheelchair)?: Total Help needed walking in hospital room?: Total Help needed climbing 3-5 steps with a railing? : Total 6 Click Score: 6    End of Session Equipment Utilized During Treatment: Gait belt Activity Tolerance: Treatment limited secondary to agitation Patient left: in chair;with call bell/phone within reach;with chair alarm set Nurse Communication: Mobility status PT Visit Diagnosis: Unsteadiness on feet (R26.81);Pain;Other symptoms and signs involving the nervous system (R29.898) Pain - Right/Left: Left Pain - part of body: Leg     Time: 0350-0938 PT Time Calculation (min) (ACUTE ONLY): 26 min  Charges:  $Therapeutic Activity: 23-37 mins                    G Codes:       Leighton Roach, PT  Acute Rehab Services  [place] 10/10/2017, 12:54 PM

## 2017-10-10 NOTE — Progress Notes (Signed)
Subjective:  The patient is alert and pleasant. She sat in a chair yesterday. She has ambulated yet.  Objective: Vital signs in last 24 hours: Temp:  [98.6 F (37 C)-98.7 F (37.1 C)] 98.6 F (37 C) (07/01 0440) Pulse Rate:  [73-82] 82 (07/01 0440) Resp:  [16] 16 (06/30 1438) BP: (91-117)/(50-79) 111/51 (07/01 0440) SpO2:  [91 %-94 %] 94 % (07/01 0440) Estimated body mass index is 53.23 kg/m as calculated from the following:   Height as of this encounter: 4\' 9"  (1.448 m).   Weight as of this encounter: 111.6 kg (246 lb).   Intake/Output from previous day: 06/30 0701 - 07/01 0700 In: 859.7 [P.O.:360; I.V.:444.7; IV Piggyback:55] Out: 600 [Urine:600] Intake/Output this shift: No intake/output data recorded.  Physical exam the patient is alert and pleasant. Her wound is healing well. Her strength is grossly normal and lower extremities.  Lab Results: Recent Labs    10/10/17 0641  WBC 6.5  HGB 12.6  HCT 38.4  PLT 220   BMET Recent Labs    10/10/17 0641  NA 135  K 3.6  CL 100  CO2 28  GLUCOSE 112*  BUN 15  CREATININE 0.72  CALCIUM 8.9    Studies/Results: No results found.  Assessment/Plan: Back and left hip/leg pain: I have encouraged the patient to mobilize. It looks like she will need rehabilitation or skilled nursing facility placement.  LOS: 5 days     Ophelia Charter 10/10/2017, 11:01 AM

## 2017-10-10 NOTE — Plan of Care (Signed)

## 2017-10-10 NOTE — Progress Notes (Signed)
PROGRESS NOTE  Michele Mcgee YIR:[id number] DOB: [birth year] DOA: 10/05/2017 PCP: Kirk Ruths, MD  HPI/Recap of past 63 hours: 72 year old female with past medical history of hypertension, CAD and recent lumbar fusion at L3-4 weeks ago secondary to lumbar spinal stenosis and radiculopathy who was at rehab and then had a fall landing on her left side.  Started having severe pain in her left upper thigh down to her foot most on the lateral side 10/10.  Unable to stand.  Seen on 6/25 with a negative CT of the hip, but pain persisted so she was reevaluated in the emergency room the night of 6/26 at [facility].  CT of the lumbar spine was done showing questionable hardware loosening and fracture of the lateral vertebral body cortex.  Emergency room physician spoke with neurosurgery who advised patient to be transferred over to [facility].  Patient was admitted and is currently stable.  Seen by neurosurgery who is ordered an MRI of the hip and spine.    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  Patient's MRI of left hip notes no evidence of hip fracture, but does note strain and low-grade partial tear of left gluteus maximus myotendinous junction and of the left hamstring.  Case discussed with Orthopedic surgery who recommended pain control and physical therapy.  Neuro surgery followed up  And after reviewing MRI, no evidence of neuro compression.  They too are recommending physical therapy.  Patient slept better.  Still pain in her left side, especially with activity, but overall anxiety improved.  No response to MiraLAX or Senokot.  .  Assessment/Plan: Principal Problem:   Fall with partial tears of left gluteus maximus myotendinous junction and left hamstring: Discussed with orthopedic surgery PA who will review films and discussed  with orthopedic attending as to best treatment options.  Increasing strength of Norco has led to decreased IV morphine.  Still with a lot of pain, especially with movement.  Some of this was anxiety which is better controlled.  Lidocaine patch working well on her leg.  We will add a second 1 to her now her hip.  Continue muscle relaxer.  Active Problems:   Coronary artery disease: Stable Anxiety/depression: Stable, no suicidal ideations.  Continue home medications.  Patient gets severely anxious and distressed.  Increased her Xanax to 0.25 p.o. twice daily which has improved her mood.    Essential hypertension: Continue antihypertensive medications, blood pressure well controlled    Morbid obesity [facility]): Patient meets criteria BMI greater than 40.  Recent lumbar fusion:   Neuro surgery notes no need for neurosurgical intervention after her fall.  Constipation: In part from dehydration as well as from opiate medications.  Patient has not had a bowel movement in about a week.  She feels like at times she has to go, but she is fearful of trying to move.  No response with MiraLAX.  We will try lactulose   sciatica:  From prolonged immobilization.  Some relief with lidocaine patch.  Once patient is more ambulatory, this will continue to improve.   Code Status: Full code  Family Communication:  Daughter at the bedside  Disposition Plan: Eventually return back to skilled nursing, possible Monday or Tues   Consultants:  Dr. Miguel Rota   case discussed with Hilbert Odor, ortho PA and Dr. Delfino Lovett, orthopedic  surgery  Procedures:  None  Antimicrobials:  None  DVT prophylaxis: Subcu heparin   Objective: Vitals:   10/09/17 0454 10/09/17 1438 10/09/17 2018 10/10/17 0440  BP: 130/65 (!) 117/50 91/79 (!) 111/51  Pulse: 84 81 73 82  Resp: 18 16    Temp: 98.3 F (36.8 C) 98.7 F (37.1 C) 98.6 F (37 C) 98.6 F (37 C)  TempSrc: Oral Oral Oral Oral  SpO2: 95% 91% 92% 94%   Weight:      Height:        Intake/Output Summary (Last 24 hours) at 10/10/2017 1342 Last data filed at 10/09/2017 1700 Gross per 24 hour  Intake 499.67 ml  Output 600 ml  Net -100.33 ml   Vitals:   10/09/17 2018 10/10/17 0440  BP: 91/79 (!) 111/51  Pulse: 73 82  Resp:    Temp: 98.6 F (37 C) 98.6 F (37 C)  SpO2: 92% 94%    Exam:   General: Alert and oriented x3, mild distress from pain  HEENT: Normocephalic and atraumatic, mucous memories are moist  Cardiovascular: Regular rate and rhythm, S1-S2  Respiratory: Clear to auscultation bilaterally  Abdomen: Soft, nontender, nondistended, hypoactive bowel sounds  Musculoskeletal: Trace pitting edema.    Skin: No skin breaks, tears or lesions  Psychiatry: Appropriate, no evidence of psychoses  Neuro:No focal deficits   Data Reviewed: CBC: Recent Labs  Lab 10/05/17 1810 10/06/17 0030 10/10/17 0641  WBC 7.3 8.1 6.5  HGB 12.1 12.1 12.6  HCT 35.7 37.4 38.4  MCV 91.4 94.0 92.1  PLT 270 234 161   Basic Metabolic Panel: Recent Labs  Lab 10/05/17 1810 10/06/17 0030 10/10/17 0641  NA 138 137 135  K 3.9 4.2 3.6  CL 98 96* 100  CO2 28 30 28   GLUCOSE 133* 164* 112*  BUN 15 13 15   CREATININE 0.67 0.71 0.72  CALCIUM 9.0 9.4 8.9   GFR: Estimated Creatinine Clearance: 68 mL/min (by C-G formula based on SCr of 0.72 mg/dL). Liver Function Tests: No results for input(s): AST, ALT, ALKPHOS, BILITOT, PROT, ALBUMIN in the last 168 hours. No results for input(s): LIPASE, AMYLASE in the last 168 hours. No results for input(s): AMMONIA in the last 168 hours. Coagulation Profile: Recent Labs  Lab 10/06/17 0030  INR 1.09   Cardiac Enzymes: No results for input(s): CKTOTAL, CKMB, CKMBINDEX, TROPONINI in the last 168 hours. BNP (last 3 results) No results for input(s): PROBNP in the last 8760 hours. HbA1C: No results for input(s): HGBA1C in the last 72 hours. CBG: No results for input(s): GLUCAP in the last 168  hours. Lipid Profile: No results for input(s): CHOL, HDL, LDLCALC, TRIG, CHOLHDL, LDLDIRECT in the last 72 hours. Thyroid Function Tests: No results for input(s): TSH, T4TOTAL, FREET4, T3FREE, THYROIDAB in the last 72 hours. Anemia Panel: No results for input(s): VITAMINB12, FOLATE, FERRITIN, TIBC, IRON, RETICCTPCT in the last 72 hours. Urine analysis: No results found for: COLORURINE, APPEARANCEUR, LABSPEC, PHURINE, GLUCOSEU, HGBUR, BILIRUBINUR, KETONESUR, PROTEINUR, UROBILINOGEN, NITRITE, LEUKOCYTESUR Sepsis Labs: @LABRCNTIP (procalcitonin:4,lacticidven:4)  )No results found for this or any previous visit (from the past 240 hour(s)).    Studies: No results found.  Scheduled Meds: . ALPRAZolam  0.25 mg Oral BID  . aspirin  81 mg Oral Daily  . atorvastatin  40 mg Oral QPM  . calcium-vitamin D  1 tablet Oral BID  . escitalopram  20 mg Oral Daily  . gabapentin  100 mg Oral BID  . heparin  5,000 Units Subcutaneous Q8H  .  lisinopril  10 mg Oral Daily   And  . hydrochlorothiazide  12.5 mg Oral Daily  . lidocaine  1 patch Transdermal Q24H  . lidocaine  1 patch Transdermal Q24H  . metoprolol succinate  25 mg Oral Daily  . multivitamin with minerals  1 tablet Oral Daily  . pantoprazole  40 mg Oral Daily  . senna-docusate  2 tablet Oral BID    Continuous Infusions: . methocarbamol (ROBAXIN)  IV 500 mg (10/10/17 1005)     LOS: 5 days     Annita Brod, MD [facility]  To reach me or the doctor on call, go to: [email or web address] Password [email or web address]  10/10/2017, 1:42 PM

## 2017-10-11 LAB — SURGICAL PCR SCREEN
MRSA, PCR: NEGATIVE
Staphylococcus aureus: NEGATIVE

## 2017-10-11 MED ORDER — PANTOPRAZOLE SODIUM 40 MG PO TBEC
40.0000 mg | DELAYED_RELEASE_TABLET | Freq: Two times a day (BID) | ORAL | Status: DC
  Administered 2017-10-11 – 2017-10-14 (×6): 40 mg via ORAL
  Filled 2017-10-11 (×6): qty 1

## 2017-10-11 MED ORDER — POLYETHYLENE GLYCOL 3350 17 G PO PACK
17.0000 g | PACK | Freq: Every day | ORAL | Status: DC
  Administered 2017-10-14: 17 g via ORAL
  Filled 2017-10-11 (×3): qty 1

## 2017-10-11 MED ORDER — LACTULOSE 10 GM/15ML PO SOLN: 10.0000 g | Freq: Every day | ORAL | Status: DC | PRN

## 2017-10-11 NOTE — Progress Notes (Signed)
PROGRESS NOTE  Michele Mcgee JJH:[id number] DOB: November 10, 1945 DOA: 10/05/2017 PCP: Kirk Ruths, MD  HPI/Recap of past 51 hours: 72 year old female with past medical history of hypertension, CAD and recent lumbar fusion at L3-4 weeks ago secondary to lumbar spinal stenosis and radiculopathy who was at rehab and then had a fall landing on her left side.  Started having severe pain in her left upper thigh down to her foot most on the lateral side 10/10.  Unable to stand.  Seen on 6/25 with a negative CT of the hip, but pain persisted so she was reevaluated in the emergency room the night of 6/26 at [facility].  CT of the lumbar spine was done showing questionable hardware loosening and fracture of the lateral vertebral body cortex.  Emergency room physician spoke with neurosurgery who advised patient to be transferred over to [facility].  Patient was admitted and is currently stable.  Seen by neurosurgery who is ordered an MRI of the hip and spine.    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  Patient's MRI of left hip notes no evidence of hip fracture, but does note strain and low-grade partial tear of left gluteus maximus myotendinous junction and of the left hamstring.  Case discussed with Orthopedic surgery who recommended pain control and physical therapy.  Neuro surgery followed up and after reviewing MRI, found no evidence of neurocompression and initially recommended physical therapy.  Over the next few days, patient has had significant issues with pain.  Initially this was involving the left leg requiring adjustments in pain medication and Lidoderm patch.  Unfortunately, it has reached the point where patient is unable to even stand for any period of time.  Starting on the evening of 7/1, patient started having pain in her right  leg.  Neurosurgery followed up after discussion with daughter and patient, plan to take patient back to the OR on 7/3 for expiration of lumbar wound and revision of lumbar hardware with possible kyphoplasty and/or possible extension of lumbar fusion/instrumentation.  Patient's hospital course also complicated by significant constipation.  She responded well to lactulose on 7/1 and had multiple bowel movements.  Today she is feeling better.  Still some back pain which can feel with pain going down both legs.  She does however feel more calm.  Assessment/Plan: Principal Problem:   Fall with partial tears of left gluteus maximus myotendinous junction and left hamstring: Discussed with orthopedic surgery PA who will review films and discussed with orthopedic attending who recommended pain control and PT.  Course complicated by plans for surgery tomorrow.  See below.  In the meantime, had been titrating upwards PRN Percocet, continued on IV as needed muscle relaxer, morphine and lidocaine patch.  With new plans for back surgery, would favor slow de-escalation of additional pain medications.  Have started with discontinuation of 1 lidocaine patch.  After surgery, would then recommend discontinuation of other lidocaine patch followed by decrease in Percocet and morphine accordingly.    Recent lumbar fusion with recurrent back pain and lower leg pain bilaterally: Plans for OR tomorrow, 7/3 for exploration, lumbar hardware revision and possible kyphoplasty and/or lumbar fusion.  Active Problems:   Coronary artery disease: Stable Anxiety/depression: Stable, no suicidal ideations.  Continue home medications.  Patient gets severely anxious and distressed.  Increased her Xanax to  0.25 p.o. twice daily which has improved her mood.    Essential hypertension: Continue antihypertensive medications, blood pressure well controlled    Morbid obesity [facility]): Patient meets criteria BMI greater than 40.    Constipation:  In part from dehydration as well as from opiate medications.  After 1 week of constipation, responded well with lactulose.  Has started daily MiraLAX scheduled.  Patient is a little nervous about this because it needs and she has to move it out of bed, but she does understand and does not want to get re-constipated.   sciatica:  From prolonged immobilization.  Some relief with lidocaine patch.  Once patient is more ambulatory, this will continue to improve.   Code Status: Full code  Family Communication:  Daughter at the bedside  Disposition Plan: OR tomorrow and then following recovery, skilled nursing   Consultants:  Dr. Miguel Rota   case discussed with Hilbert Odor, ortho PA and Dr. Delfino Lovett, orthopedic surgery  Procedures:  7/3: Planned lumbar revision  Antimicrobials:  None  DVT prophylaxis: Subcu heparin   Objective: Vitals:   10/10/17 1404 10/10/17 1607 10/10/17 2119 10/11/17 0412  BP: (!) 85/75 131/60 96/65 (!) 111/52  Pulse: 75 88 81 80  Resp: 18  16   Temp: 98.7 F (37.1 C)  98.6 F (37 C) 97.9 F (36.6 C)  TempSrc: Oral  Oral Oral  SpO2: 92% 92% 90% 96%  Weight:      Height:        Intake/Output Summary (Last 24 hours) at 10/11/2017 1451 Last data filed at 10/11/2017 0645 Gross per 24 hour  Intake 240 ml  Output 1350 ml  Net -1110 ml   Vitals:   10/10/17 2119 10/11/17 0412  BP: 96/65 (!) 111/52  Pulse: 81 80  Resp: 16   Temp: 98.6 F (37 C) 97.9 F (36.6 C)  SpO2: 90% 96%    Exam:   General: Alert and oriented x3, no acute distress  HEENT: Normocephalic and atraumatic, mucous memories are moist  Cardiovascular: Regular rate and rhythm, S1-S2  Respiratory: Clear to auscultation bilaterally  Abdomen: Soft, nontender, nondistended, hypoactive bowel sounds  Musculoskeletal: Trace pitting edema.    Skin: No skin breaks, tears or lesions  Psychiatry: Appropriate, no evidence of psychoses  Neuro:No focal deficits   Data  Reviewed: CBC: Recent Labs  Lab 10/05/17 1810 10/06/17 0030 10/10/17 0641  WBC 7.3 8.1 6.5  HGB 12.1 12.1 12.6  HCT 35.7 37.4 38.4  MCV 91.4 94.0 92.1  PLT 270 234 737   Basic Metabolic Panel: Recent Labs  Lab 10/05/17 1810 10/06/17 0030 10/10/17 0641  NA 138 137 135  K 3.9 4.2 3.6  CL 98 96* 100  CO2 28 30 28   GLUCOSE 133* 164* 112*  BUN 15 13 15   CREATININE 0.67 0.71 0.72  CALCIUM 9.0 9.4 8.9   GFR: Estimated Creatinine Clearance: 68 mL/min (by C-G formula based on SCr of 0.72 mg/dL). Liver Function Tests: No results for input(s): AST, ALT, ALKPHOS, BILITOT, PROT, ALBUMIN in the last 168 hours. No results for input(s): LIPASE, AMYLASE in the last 168 hours. No results for input(s): AMMONIA in the last 168 hours. Coagulation Profile: Recent Labs  Lab 10/06/17 0030  INR 1.09   Cardiac Enzymes: No results for input(s): CKTOTAL, CKMB, CKMBINDEX, TROPONINI in the last 168 hours. BNP (last 3 results) No results for input(s): PROBNP in the last 8760 hours. HbA1C: No results for input(s): HGBA1C in the last 72 hours. CBG: No  results for input(s): GLUCAP in the last 168 hours. Lipid Profile: No results for input(s): CHOL, HDL, LDLCALC, TRIG, CHOLHDL, LDLDIRECT in the last 72 hours. Thyroid Function Tests: No results for input(s): TSH, T4TOTAL, FREET4, T3FREE, THYROIDAB in the last 72 hours. Anemia Panel: No results for input(s): VITAMINB12, FOLATE, FERRITIN, TIBC, IRON, RETICCTPCT in the last 72 hours. Urine analysis: No results found for: COLORURINE, APPEARANCEUR, LABSPEC, PHURINE, GLUCOSEU, HGBUR, BILIRUBINUR, KETONESUR, PROTEINUR, UROBILINOGEN, NITRITE, LEUKOCYTESUR Sepsis Labs: @LABRCNTIP (procalcitonin:4,lacticidven:4)  )No results found for this or any previous visit (from the past 240 hour(s)).    Studies: No results found.  Scheduled Meds: . ALPRAZolam  0.25 mg Oral BID  . aspirin  81 mg Oral Daily  . atorvastatin  40 mg Oral QPM  . calcium-vitamin  D  1 tablet Oral BID  . escitalopram  20 mg Oral Daily  . gabapentin  100 mg Oral BID  . heparin  5,000 Units Subcutaneous Q8H  . lisinopril  10 mg Oral Daily   And  . hydrochlorothiazide  12.5 mg Oral Daily  . lidocaine  1 patch Transdermal Q24H  . metoprolol succinate  25 mg Oral Daily  . multivitamin with minerals  1 tablet Oral Daily  . pantoprazole  40 mg Oral BID  . polyethylene glycol  17 g Oral Daily  . senna-docusate  2 tablet Oral BID    Continuous Infusions: . methocarbamol (ROBAXIN)  IV 500 mg (10/10/17 1005)     LOS: 6 days     Annita Brod, MD [facility]  To reach me or the doctor on call, go to: [email or web address] Password [email or web address]  10/11/2017, 2:51 PM

## 2017-10-11 NOTE — Progress Notes (Signed)
Subjective: The patient complains of back and bilateral leg pain.  She does not feel she is made any progress.  She is not able to get out of bed.  Objective: Vital signs in last 24 hours: Temp:  [97.9 F (36.6 C)-98.7 F (37.1 C)] 97.9 F (36.6 C) (07/02 0412) Pulse Rate:  [75-88] 80 (07/02 0412) Resp:  [16-18] 16 (07/01 2119) BP: (85-131)/(52-75) 111/52 (07/02 0412) SpO2:  [90 %-96 %] 96 % (07/02 0412) Estimated body mass index is 53.23 kg/m as calculated from the following:   Height as of this encounter: 4\' 9"  (1.448 m).   Weight as of this encounter: 111.6 kg (246 lb).   Intake/Output from previous day: 07/01 0701 - 07/02 0700 In: 720 [P.O.:720] Out: 400 [Urine:400] Intake/Output this shift: No intake/output data recorded.  Physical exam patient is alert and pleasant.  She moves her lower extremities well.  He has pain with even the slightest of motion.  Lab Results: Recent Labs    10/10/17 0641  WBC 6.5  HGB 12.6  HCT 38.4  PLT 220   BMET Recent Labs    10/10/17 0641  NA 135  K 3.6  CL 100  CO2 28  GLUCOSE 112*  BUN 15  CREATININE 0.72  CALCIUM 8.9    Studies/Results: No results found.  Assessment/Plan: Lumbar fracture, lumbago: I have discussed the situation with the patient in person and with her daughter on the telephone.  We have discussed the various treatment options including continued medical management and given that time versus exploration of her lumbar wound and revision of her lumbar hardware possible kyphoplasty and possible extension of her lumbar fusion/instrumentation.  I have described surgery to her.  We have discussed the risk of surgery includes the risk of anesthesia, hemorrhage, infection, spinal fluid leak, nerve injury, instrumentation malfunction or malplacement, fusion failure, medical risk, failure to relieve the pain, worsening pain, etc.  I have answered all her questions.  She wants to proceed with surgery.  We will shoot for  tomorrow.  LOS: 6 days     Ophelia Charter 10/11/2017, 7:26 AM

## 2017-10-12 ENCOUNTER — Inpatient Hospital Stay (HOSPITAL_COMMUNITY): Payer: Medicare Other | Admitting: Anesthesiology

## 2017-10-12 ENCOUNTER — Inpatient Hospital Stay (HOSPITAL_COMMUNITY): Payer: Medicare Other

## 2017-10-12 ENCOUNTER — Encounter (HOSPITAL_COMMUNITY): Payer: Self-pay | Admitting: *Deleted

## 2017-10-12 ENCOUNTER — Encounter (HOSPITAL_COMMUNITY)
Admission: AD | Disposition: A | Discharge status: Skilled Nursing Facility | Payer: Self-pay | Source: Other Acute Inpatient Hospital | Attending: Internal Medicine

## 2017-10-12 DIAGNOSIS — M4846XA Fatigue fracture of vertebra, lumbar region, initial encounter for fracture: Secondary | ICD-10-CM | POA: Diagnosis present

## 2017-10-12 LAB — URINALYSIS, ROUTINE W REFLEX MICROSCOPIC

## 2017-10-12 LAB — URINALYSIS, MICROSCOPIC (REFLEX): WBC, UA: >50 WBC/hpf (ref 0–5)

## 2017-10-12 SURGERY — POSTERIOR LUMBAR FUSION 1 WITH HARDWARE REMOVAL
Anesthesia: General | Site: Spine Lumbar

## 2017-10-12 MED ORDER — SODIUM CHLORIDE 0.9 % IV SOLN
250.0000 mL | INTRAVENOUS | Status: DC
  Administered 2017-10-12: 250 mL via INTRAVENOUS

## 2017-10-12 MED ORDER — ACETAMINOPHEN 500 MG PO TABS
1000.0000 mg | ORAL_TABLET | Freq: Four times a day (QID) | ORAL | Status: AC
  Administered 2017-10-12 – 2017-10-13 (×4): 1000 mg via ORAL
  Filled 2017-10-12 (×4): qty 2

## 2017-10-12 MED ORDER — THROMBIN 5000 UNITS EX SOLR
OROMUCOSAL | Status: DC | PRN
  Administered 2017-10-12: 5 mL via TOPICAL

## 2017-10-12 MED ORDER — PROPOFOL 10 MG/ML IV BOLUS
INTRAVENOUS | Status: DC | PRN
  Administered 2017-10-12: 200 mg via INTRAVENOUS

## 2017-10-12 MED ORDER — LACTATED RINGERS IV SOLN
INTRAVENOUS | Status: DC
  Administered 2017-10-12: 10:00:00 via INTRAVENOUS

## 2017-10-12 MED ORDER — PHENOL 1.4 % MT LIQD: 1.0000 | OROMUCOSAL | Status: DC | PRN

## 2017-10-12 MED ORDER — BACITRACIN ZINC 500 UNIT/GM EX OINT
TOPICAL_OINTMENT | CUTANEOUS | Status: AC
  Filled 2017-10-12: qty 28.35

## 2017-10-12 MED ORDER — ONDANSETRON HCL 4 MG/2ML IJ SOLN: 4.0000 mg | Freq: Once | INTRAMUSCULAR | Status: DC | PRN

## 2017-10-12 MED ORDER — SODIUM CHLORIDE 0.9 % IV SOLN
INTRAVENOUS | Status: DC | PRN
  Administered 2017-10-12: 500 mL

## 2017-10-12 MED ORDER — MENTHOL 3 MG MT LOZG: 1.0000 | LOZENGE | OROMUCOSAL | Status: DC | PRN

## 2017-10-12 MED ORDER — VANCOMYCIN HCL IN DEXTROSE 750-5 MG/150ML-% IV SOLN
750.0000 mg | Freq: Two times a day (BID) | INTRAVENOUS | Status: DC
Start: 2017-10-12 — End: 2017-10-13
  Administered 2017-10-12 – 2017-10-13 (×2): 750 mg via INTRAVENOUS
  Filled 2017-10-12 (×2): qty 150

## 2017-10-12 MED ORDER — PHENYLEPHRINE 40 MCG/ML (10ML) SYRINGE FOR IV PUSH (FOR BLOOD PRESSURE SUPPORT)
PREFILLED_SYRINGE | INTRAVENOUS | Status: DC | PRN
  Administered 2017-10-12 (×5): 80 ug via INTRAVENOUS

## 2017-10-12 MED ORDER — HYDROMORPHONE HCL 2 MG PO TABS
ORAL_TABLET | ORAL | Status: AC
  Filled 2017-10-12: qty 1

## 2017-10-12 MED ORDER — VANCOMYCIN HCL 1 G IV SOLR
INTRAVENOUS | Status: DC | PRN
  Administered 2017-10-12: 1000 mg

## 2017-10-12 MED ORDER — VANCOMYCIN HCL 1000 MG IV SOLR
INTRAVENOUS | Status: AC
  Filled 2017-10-12: qty 1000

## 2017-10-12 MED ORDER — BACITRACIN ZINC 500 UNIT/GM EX OINT
TOPICAL_OINTMENT | CUTANEOUS | Status: DC | PRN
  Administered 2017-10-12: 1 via TOPICAL

## 2017-10-12 MED ORDER — CIPROFLOXACIN IN D5W 400 MG/200ML IV SOLN
400.0000 mg | Freq: Two times a day (BID) | INTRAVENOUS | Status: DC
  Administered 2017-10-12 – 2017-10-13 (×3): 400 mg via INTRAVENOUS
  Filled 2017-10-12 (×4): qty 200

## 2017-10-12 MED ORDER — CIPROFLOXACIN IN D5W 400 MG/200ML IV SOLN
400.0000 mg | INTRAVENOUS | Status: DC
  Filled 2017-10-12: qty 200

## 2017-10-12 MED ORDER — BUPIVACAINE-EPINEPHRINE (PF) 0.5% -1:200000 IJ SOLN
INTRAMUSCULAR | Status: DC | PRN
  Administered 2017-10-12: 10 mL

## 2017-10-12 MED ORDER — ACETAMINOPHEN 650 MG RE SUPP: 650.0000 mg | RECTAL | Status: DC | PRN

## 2017-10-12 MED ORDER — FENTANYL CITRATE (PF) 250 MCG/5ML IJ SOLN
INTRAMUSCULAR | Status: AC
  Filled 2017-10-12: qty 5

## 2017-10-12 MED ORDER — ONDANSETRON HCL 4 MG PO TABS: 4.0000 mg | ORAL_TABLET | Freq: Four times a day (QID) | ORAL | Status: DC | PRN

## 2017-10-12 MED ORDER — PHENYLEPHRINE HCL 10 MG/ML IJ SOLN
INTRAMUSCULAR | Status: DC | PRN
  Administered 2017-10-12: 45 ug/min via INTRAVENOUS

## 2017-10-12 MED ORDER — ACETAMINOPHEN 325 MG PO TABS
650.0000 mg | ORAL_TABLET | ORAL | Status: DC | PRN
  Administered 2017-10-13: 650 mg via ORAL
  Filled 2017-10-12: qty 2

## 2017-10-12 MED ORDER — VANCOMYCIN HCL 1000 MG IV SOLR
INTRAVENOUS | Status: DC | PRN
  Administered 2017-10-12: 1000 mg via INTRAVENOUS

## 2017-10-12 MED ORDER — MORPHINE SULFATE (PF) 4 MG/ML IV SOLN
4.0000 mg | INTRAVENOUS | Status: DC | PRN
  Administered 2017-10-12: 4 mg via INTRAVENOUS
  Filled 2017-10-12: qty 1

## 2017-10-12 MED ORDER — ONDANSETRON HCL 4 MG/2ML IJ SOLN: 4.0000 mg | Freq: Four times a day (QID) | INTRAMUSCULAR | Status: DC | PRN

## 2017-10-12 MED ORDER — DEXAMETHASONE SODIUM PHOSPHATE 10 MG/ML IJ SOLN
INTRAMUSCULAR | Status: DC | PRN
  Administered 2017-10-12: 5 mg via INTRAVENOUS

## 2017-10-12 MED ORDER — LIDOCAINE 2% (20 MG/ML) 5 ML SYRINGE
INTRAMUSCULAR | Status: DC | PRN
  Administered 2017-10-12: 100 mg via INTRAVENOUS

## 2017-10-12 MED ORDER — CIPROFLOXACIN IN D5W 400 MG/200ML IV SOLN
INTRAVENOUS | Status: DC | PRN
  Administered 2017-10-12: 400 mg via INTRAVENOUS

## 2017-10-12 MED ORDER — ONDANSETRON HCL 4 MG/2ML IJ SOLN
INTRAMUSCULAR | Status: DC | PRN
  Administered 2017-10-12: 4 mg via INTRAVENOUS

## 2017-10-12 MED ORDER — BUPIVACAINE-EPINEPHRINE (PF) 0.5% -1:200000 IJ SOLN
INTRAMUSCULAR | Status: AC
  Filled 2017-10-12: qty 30

## 2017-10-12 MED ORDER — SODIUM CHLORIDE 0.9% FLUSH
3.0000 mL | Freq: Two times a day (BID) | INTRAVENOUS | Status: DC
  Administered 2017-10-12 – 2017-10-14 (×3): 3 mL via INTRAVENOUS

## 2017-10-12 MED ORDER — 0.9 % SODIUM CHLORIDE (POUR BTL) OPTIME
TOPICAL | Status: DC | PRN
  Administered 2017-10-12 (×2): 1000 mL

## 2017-10-12 MED ORDER — HYDROMORPHONE HCL 2 MG PO TABS
2.0000 mg | ORAL_TABLET | ORAL | Status: DC | PRN
  Administered 2017-10-12: 2 mg via ORAL
  Filled 2017-10-12: qty 1

## 2017-10-12 MED ORDER — DOCUSATE SODIUM 100 MG PO CAPS
100.0000 mg | ORAL_CAPSULE | Freq: Two times a day (BID) | ORAL | Status: DC
  Administered 2017-10-12 – 2017-10-13 (×2): 100 mg via ORAL
  Filled 2017-10-12 (×2): qty 1

## 2017-10-12 MED ORDER — BUPIVACAINE LIPOSOME 1.3 % IJ SUSP
20.0000 mL | INTRAMUSCULAR | Status: AC
  Administered 2017-10-12: 20 mL
  Filled 2017-10-12: qty 20

## 2017-10-12 MED ORDER — PROPOFOL 10 MG/ML IV BOLUS
INTRAVENOUS | Status: AC
  Filled 2017-10-12: qty 20

## 2017-10-12 MED ORDER — HYDROMORPHONE HCL 1 MG/ML IJ SOLN
INTRAMUSCULAR | Status: AC
  Filled 2017-10-12: qty 1

## 2017-10-12 MED ORDER — FENTANYL CITRATE (PF) 250 MCG/5ML IJ SOLN
INTRAMUSCULAR | Status: DC | PRN
  Administered 2017-10-12 (×2): 50 ug via INTRAVENOUS
  Administered 2017-10-12: 100 ug via INTRAVENOUS

## 2017-10-12 MED ORDER — SODIUM CHLORIDE 0.9% FLUSH: 3.0000 mL | INTRAVENOUS | Status: DC | PRN

## 2017-10-12 MED ORDER — ROCURONIUM BROMIDE 10 MG/ML (PF) SYRINGE
PREFILLED_SYRINGE | INTRAVENOUS | Status: DC | PRN
  Administered 2017-10-12: 50 mg via INTRAVENOUS
  Administered 2017-10-12 (×2): 10 mg via INTRAVENOUS

## 2017-10-12 MED ORDER — BISACODYL 10 MG RE SUPP: 10.0000 mg | Freq: Every day | RECTAL | Status: DC | PRN

## 2017-10-12 MED ORDER — THROMBIN 5000 UNITS EX SOLR
CUTANEOUS | Status: AC
  Filled 2017-10-12: qty 5000

## 2017-10-12 MED ORDER — HYDROMORPHONE HCL 1 MG/ML IJ SOLN
0.2500 mg | INTRAMUSCULAR | Status: DC | PRN
  Administered 2017-10-12 (×2): 0.5 mg via INTRAVENOUS

## 2017-10-12 MED ORDER — SUGAMMADEX SODIUM 200 MG/2ML IV SOLN
INTRAVENOUS | Status: DC | PRN
  Administered 2017-10-12: 300 mg via INTRAVENOUS

## 2017-10-12 MED ORDER — ZOLPIDEM TARTRATE 5 MG PO TABS: 5.0000 mg | ORAL_TABLET | Freq: Every evening | ORAL | Status: DC | PRN

## 2017-10-12 SURGICAL SUPPLY — 77 items
BAG DECANTER FOR FLEXI CONT (MISCELLANEOUS) ×3 IMPLANT
BENZOIN TINCTURE PRP APPL 2/3 (GAUZE/BANDAGES/DRESSINGS) ×3 IMPLANT
BLADE CLIPPER SURG (BLADE) IMPLANT
BUR MATCHSTICK NEURO 3.0 LAGG (BURR) ×3 IMPLANT
BUR PRECISION FLUTE 6.0 (BURR) ×3 IMPLANT
CANISTER SUCT 3000ML PPV (MISCELLANEOUS) ×3 IMPLANT
CAP LOCKING CREO THRD (Cap) ×6 IMPLANT
CARTRIDGE OIL MAESTRO DRILL (MISCELLANEOUS) ×1 IMPLANT
CEMENT KYPHON C01A KIT/MIXER (Cement) ×3 IMPLANT
CLOSURE STERI-STRIP 1/2X4 (GAUZE/BANDAGES/DRESSINGS) ×1
CLOSURE WOUND 1/2 X4 (GAUZE/BANDAGES/DRESSINGS) ×1
CLSR STERI-STRIP ANTIMIC 1/2X4 (GAUZE/BANDAGES/DRESSINGS) ×2 IMPLANT
CONT SPEC 4OZ CLIKSEAL STRL BL (MISCELLANEOUS) ×3 IMPLANT
COVER BACK TABLE 60X90IN (DRAPES) ×3 IMPLANT
DECANTER SPIKE VIAL GLASS SM (MISCELLANEOUS) ×3 IMPLANT
DIFFUSER DRILL AIR PNEUMATIC (MISCELLANEOUS) ×3 IMPLANT
DRAPE C-ARM 42X72 X-RAY (DRAPES) ×9 IMPLANT
DRAPE HALF SHEET 40X57 (DRAPES) ×9 IMPLANT
DRAPE LAPAROTOMY 100X72X124 (DRAPES) ×3 IMPLANT
DRAPE SURG 17X23 STRL (DRAPES) ×12 IMPLANT
DRIVER SHAFT CREO (MISCELLANEOUS) ×24 IMPLANT
DRSG OPSITE POSTOP 4X8 (GAUZE/BANDAGES/DRESSINGS) ×3 IMPLANT
ELECT BLADE 4.0 EZ CLEAN MEGAD (MISCELLANEOUS) ×3
ELECT REM PT RETURN 9FT ADLT (ELECTROSURGICAL) ×3
ELECTRODE BLDE 4.0 EZ CLN MEGD (MISCELLANEOUS) ×1 IMPLANT
ELECTRODE REM PT RTRN 9FT ADLT (ELECTROSURGICAL) ×1 IMPLANT
EVACUATOR 1/8 PVC DRAIN (DRAIN) ×3 IMPLANT
GAUZE SPONGE 4X4 12PLY STRL (GAUZE/BANDAGES/DRESSINGS) ×3 IMPLANT
GAUZE SPONGE 4X4 16PLY XRAY LF (GAUZE/BANDAGES/DRESSINGS) IMPLANT
GLOVE BIO SURGEON STRL SZ 6.5 (GLOVE) ×4 IMPLANT
GLOVE BIO SURGEON STRL SZ8 (GLOVE) ×6 IMPLANT
GLOVE BIO SURGEON STRL SZ8.5 (GLOVE) ×6 IMPLANT
GLOVE BIO SURGEONS STRL SZ 6.5 (GLOVE) ×2
GLOVE BIOGEL PI IND STRL 6.5 (GLOVE) ×3 IMPLANT
GLOVE BIOGEL PI IND STRL 7.5 (GLOVE) ×2 IMPLANT
GLOVE BIOGEL PI INDICATOR 6.5 (GLOVE) ×6
GLOVE BIOGEL PI INDICATOR 7.5 (GLOVE) ×4
GLOVE EXAM NITRILE LRG STRL (GLOVE) IMPLANT
GLOVE EXAM NITRILE XL STR (GLOVE) IMPLANT
GLOVE EXAM NITRILE XS STR PU (GLOVE) IMPLANT
GLOVE SURG SS PI 6.5 STRL IVOR (GLOVE) ×15 IMPLANT
GOWN STRL REUS W/ TWL LRG LVL3 (GOWN DISPOSABLE) ×3 IMPLANT
GOWN STRL REUS W/ TWL XL LVL3 (GOWN DISPOSABLE) ×2 IMPLANT
GOWN STRL REUS W/TWL 2XL LVL3 (GOWN DISPOSABLE) IMPLANT
GOWN STRL REUS W/TWL LRG LVL3 (GOWN DISPOSABLE) ×6
GOWN STRL REUS W/TWL XL LVL3 (GOWN DISPOSABLE) ×4
HEMOSTAT POWDER KIT SURGIFOAM (HEMOSTASIS) ×3 IMPLANT
KIT BASIN OR (CUSTOM PROCEDURE TRAY) ×3 IMPLANT
KIT TURNOVER KIT B (KITS) ×3 IMPLANT
LOCKING CAP CREO THRD (Cap) ×18 IMPLANT
MILL MEDIUM DISP (BLADE) IMPLANT
MIXER KYPHON (MISCELLANEOUS) ×3 IMPLANT
NEEDLE HYPO 21X1.5 SAFETY (NEEDLE) ×3 IMPLANT
NEEDLE HYPO 22GX1.5 SAFETY (NEEDLE) ×3 IMPLANT
NS IRRIG 1000ML POUR BTL (IV SOLUTION) ×3 IMPLANT
OIL CARTRIDGE MAESTRO DRILL (MISCELLANEOUS) ×3
PACK AFFIRM FILLER DELIVERY (MISCELLANEOUS) ×3 IMPLANT
PACK LAMINECTOMY NEURO (CUSTOM PROCEDURE TRAY) ×3 IMPLANT
PAD ARMBOARD 7.5X6 YLW CONV (MISCELLANEOUS) ×9 IMPLANT
PATTIES SURGICAL .5 X1 (DISPOSABLE) IMPLANT
ROD CREO 60MM (Rod) ×6 IMPLANT
SCREW CREO 7.5X50 POLY THRED (Screw) ×3 IMPLANT
SCREW CREO 8.5X45 POLY THRED (Screw) ×9 IMPLANT
SCREW CREO FEN 7.5X45MM POLY (Screw) ×6 IMPLANT
SPONGE LAP 4X18 RFD (DISPOSABLE) IMPLANT
SPONGE NEURO XRAY DETECT 1X3 (DISPOSABLE) IMPLANT
SPONGE SURGIFOAM ABS GEL 100 (HEMOSTASIS) IMPLANT
STRIP BIOACTIVE 10CC 25X50X8 (Miscellaneous) ×3 IMPLANT
STRIP CLOSURE SKIN 1/2X4 (GAUZE/BANDAGES/DRESSINGS) ×2 IMPLANT
SUT VIC AB 1 CT1 18XBRD ANBCTR (SUTURE) ×2 IMPLANT
SUT VIC AB 1 CT1 8-18 (SUTURE) ×4
SUT VIC AB 2-0 CP2 18 (SUTURE) ×6 IMPLANT
SYR 20CC LL (SYRINGE) ×6 IMPLANT
TOWEL GREEN STERILE (TOWEL DISPOSABLE) ×3 IMPLANT
TOWEL GREEN STERILE FF (TOWEL DISPOSABLE) ×3 IMPLANT
TRAY FOLEY MTR SLVR 16FR STAT (SET/KITS/TRAYS/PACK) ×3 IMPLANT
WATER STERILE IRR 1000ML POUR (IV SOLUTION) ×3 IMPLANT

## 2017-10-12 NOTE — Anesthesia Procedure Notes (Signed)
Procedure Name: Intubation Date/Time: 10/12/2017 11:14 AM Performed by: Julieta Bellini, CRNA Pre-anesthesia Checklist: Patient identified, Emergency Drugs available, Suction available and Patient being monitored Patient Re-evaluated:Patient Re-evaluated prior to induction Oxygen Delivery Method: Circle system utilized Preoxygenation: Pre-oxygenation with 100% oxygen Induction Type: IV induction Ventilation: Mask ventilation without difficulty and Oral airway inserted - appropriate to patient size Laryngoscope Size: Glidescope and 3 Grade View: Grade I Tube type: Oral Tube size: 7.0 mm Number of attempts: 1 Airway Equipment and Method: Video-laryngoscopy and Rigid stylet Placement Confirmation: ETT inserted through vocal cords under direct vision and breath sounds checked- equal and bilateral Secured at: 21 cm Tube secured with: Tape Dental Injury: Teeth and Oropharynx as per pre-operative assessment

## 2017-10-12 NOTE — Progress Notes (Signed)
PROGRESS NOTE  RAYMA HEGG TFT:[id number] DOB: 22-Sep-1945 DOA: 10/05/2017 PCP: Kirk Ruths, MD  HPI/Recap of past 24 hours: 72 fem hypertension, CAD and recent lumbar fusion at L3-4 weeks ago secondary to lumbar spinal stenosis and radiculopathy who was at rehab and then had a fall landing on her left side.    severe pain in her left upper thigh down to her foot most on the lateral side 10/10.  Unable to stand.  Seen on 6/25 with a negative CT of the hip, but pain persisted so she was reevaluated in the emergency room the night of 6/26 at [facility].   CT of the lumbar spine was done showing questionable hardware loosening and fracture of the lateral vertebral body cortex.  Emergency room physician spoke with neurosurgery who advised patient to be transferred over to [facility].  Patient was admitted and is currently stable.  Seen by neurosurgery who is ordered an MRI of the hip and spine.    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    Patient's MRI no evidence of hip fracture, but does note strain and low-grade partial tear of left gluteus maximus myotendinous junction and of the left hamstring.  Case discussed with Orthopedic surgery who recommended pain control and physical therapy.    Neuro surgery followed up and after reviewing MRI, found no evidence of neurocompression and initially recommended physical therapy.  Over the next few days, patient has had significant issues with pain.  Initially this was involving the left leg requiring adjustments in pain medication and Lidoderm patch.  Unfortunately, it has reached the point where patient is unable to even stand for any period of time.  Starting on the evening of 7/1, patient started having pain in her right leg.    Neurosurgery followed up after discussion with  daughter and patient, plan to take patient back to the OR on 7/3 for expiration of lumbar wound and revision of lumbar hardware with possible kyphoplasty and/or possible extension of lumbar fusion/instrumentation.  Patient's hospital course also complicated by significant constipation.  She responded well to lactulose on 7/1 and had multiple bowel movements.  Today she is feeling better.  Still some back pain which can feel with pain going down both legs.  She does however feel more calm.  Assessment/Plan: Principal Problem:   Fall with partial tears of left gluteus maximus myotendinous junction and left hamstring:  Discussed with orthopedic surgery PA who will review films and discussed with orthopedic attending who recommended pain control and PT.   PRN Percocet, continued on IV as needed muscle relaxer, morphine and lidocaine patch.   favor slow de-escalation of additional pain medications.   Have started with discontinuation of 1 lidocaine patch.  After surgery, would then recommend discontinuation of other lidocaine patch followed by decrease in Percocet and morphine accordingly.    Recent lumbar fusion with recurrent back pain and lower leg pain bilaterally:  Revision of surgery 7/3-NS following and appreciated    Coronary artery disease: Stable Anxiety/depression: Stable, no suicidal ideations.  Continue home medications.  Patient gets severely anxious and distressed.  Increased her Xanax to 0.25 p.o. twice daily which has improved her mood.    Essential hypertension: Continue antihypertensive medications, blood pressure well controlled    Morbid obesity [facility]): Patient meets criteria BMI greater than  50--super-morbid obeseity and life-threatening  Constipation: In part from dehydration as well as from opiate medications.  After 1 week of constipation, responded well with lactulose.  Has started daily MiraLAX scheduled.  Patient is a little nervous about this because it needs and she has  to move it out of bed, but she does understand and does not want to get re-constipated.   sciatica:  From prolonged immobilization.  Some relief with lidocaine patch.  Once patient is more ambulatory, this will continue to improve.   Code Status: Full code  Family Communication:  Daughter at the bedside  Disposition Plan: OR tomorrow and then following recovery, skilled nursing   Consultants:  Dr. Miguel Rota   case discussed with Hilbert Odor, ortho PA and Dr. Delfino Lovett, orthopedic surgery  Procedures:  7/3: Planned lumbar revision  Antimicrobials:  None  DVT prophylaxis: Subcu heparin   Objective: Vitals:   10/12/17 1508 10/12/17 1515 10/12/17 1527 10/12/17 1530  BP:  (!) 132/44  123/69  Pulse: 82 77 74 75  Resp: 19 19 18 18   Temp:      TempSrc:      SpO2: 95% 95% 98% 97%  Weight:      Height:        Intake/Output Summary (Last 24 hours) at 10/12/2017 1556 Last data filed at 10/12/2017 1449 Gross per 24 hour  Intake 1400 ml  Output 1301 ml  Net 99 ml   Vitals:   10/12/17 1527 10/12/17 1530  BP:  123/69  Pulse: 74 75  Resp: 18 18  Temp:    SpO2: 98% 97%    Exam:   Seen in pacu  No distress no fever no dysuira   Exam eomi ncat ovbese thicki neck s1 s 2no m/r/g abd soft obese nt, bruide to lower R quadrant No le edema Vac in place Moves limbs x4 Skin no other deficit   Data Reviewed: CBC: Recent Labs  Lab 10/05/17 1810 10/06/17 0030 10/10/17 0641  WBC 7.3 8.1 6.5  HGB 12.1 12.1 12.6  HCT 35.7 37.4 38.4  MCV 91.4 94.0 92.1  PLT 270 234 409   Basic Metabolic Panel: Recent Labs  Lab 10/05/17 1810 10/06/17 0030 10/10/17 0641  NA 138 137 135  K 3.9 4.2 3.6  CL 98 96* 100  CO2 28 30 28   GLUCOSE 133* 164* 112*  BUN 15 13 15   CREATININE 0.67 0.71 0.72  CALCIUM 9.0 9.4 8.9   GFR: Estimated Creatinine Clearance: 68 mL/min (by C-G formula based on SCr of 0.72 mg/dL). Liver Function Tests: No results for input(s):  AST, ALT, ALKPHOS, BILITOT, PROT, ALBUMIN in the last 168 hours. No results for input(s): LIPASE, AMYLASE in the last 168 hours. No results for input(s): AMMONIA in the last 168 hours. Coagulation Profile: Recent Labs  Lab 10/06/17 0030  INR 1.09   Cardiac Enzymes: No results for input(s): CKTOTAL, CKMB, CKMBINDEX, TROPONINI in the last 168 hours. BNP (last 3 results) No results for input(s): PROBNP in the last 8760 hours. HbA1C: No results for input(s): HGBA1C in the last 72 hours. CBG: No results for input(s): GLUCAP in the last 168 hours. Lipid Profile: No results for input(s): CHOL, HDL, LDLCALC, TRIG, CHOLHDL, LDLDIRECT in the last 72 hours. Thyroid Function Tests: No results for input(s): TSH, T4TOTAL, FREET4, T3FREE, THYROIDAB in the last 72 hours. Anemia Panel: No results for input(s): VITAMINB12, FOLATE, FERRITIN, TIBC, IRON, RETICCTPCT in the last 72 hours. Urine analysis:    Component Value Date/Time  COLORURINE YELLOW 10/12/2017 1147   APPEARANCEUR TURBID (A) 10/12/2017 1147   LABSPEC RESULTS UNAVAILABLE DUE TO INTERFERING SUBSTANCE 10/12/2017 1147   PHURINE RESULTS UNAVAILABLE DUE TO INTERFERING SUBSTANCE 10/12/2017 1147   GLUCOSEU RESULTS UNAVAILABLE DUE TO INTERFERING SUBSTANCE (A) 10/12/2017 1147   HGBUR RESULTS UNAVAILABLE DUE TO INTERFERING SUBSTANCE (A) 10/12/2017 1147   BILIRUBINUR RESULTS UNAVAILABLE DUE TO INTERFERING SUBSTANCE (A) 10/12/2017 1147   KETONESUR RESULTS UNAVAILABLE DUE TO INTERFERING SUBSTANCE (A) 10/12/2017 1147   PROTEINUR RESULTS UNAVAILABLE DUE TO INTERFERING SUBSTANCE (A) 10/12/2017 1147   NITRITE RESULTS UNAVAILABLE DUE TO INTERFERING SUBSTANCE (A) 10/12/2017 1147   LEUKOCYTESUR RESULTS UNAVAILABLE DUE TO INTERFERING SUBSTANCE (A) 10/12/2017 1147   Sepsis Labs: @LABRCNTIP (procalcitonin:4,lacticidven:4)  ) Recent Results (from the past 240 hour(s))  Surgical pcr screen     Status: None   Collection Time: 10/11/17  9:16 PM  Result  Value Ref Range Status   MRSA, PCR NEGATIVE NEGATIVE Final   Staphylococcus aureus NEGATIVE NEGATIVE Final    Comment: (NOTE) The Xpert SA Assay (FDA approved for NASAL specimens in patients 11 years of age and older), is one component of a comprehensive surveillance program. It is not intended to diagnose infection nor to guide or monitor treatment. Performed at [facility], [place] [address], [place], [place] [address]       Studies: Dg Lumbar Spine 2-3 Views  Result Date: 10/12/2017 CLINICAL DATA:  Revision of L3-4 posterior fusion. EXAM: DG C-ARM 61-120 MIN; LUMBAR SPINE - 2-3 VIEW FLUOROSCOPY TIME:  1 minutes 0 seconds. COMPARISON:  MRI of October 07, 2017. FINDINGS: Two intraoperative fluoroscopic images were obtained of the lower lumbar spine. Previously noted posterior fusion of L3-4 is noted with bilateral intrapedicular screw placement and interbody fusion. Interval placement of bilateral intrapedicular screws are noted at L5. IMPRESSION: Status post surgical posterior fusion of L3-4 and L4-5. Electronically Signed   By: Marijo Conception, M.D.   On: 10/12/2017 14:26   Dg C-arm 1-60 Min  Result Date: 10/12/2017 CLINICAL DATA:  Revision of L3-4 posterior fusion. EXAM: DG C-ARM 61-120 MIN; LUMBAR SPINE - 2-3 VIEW FLUOROSCOPY TIME:  1 minutes 0 seconds. COMPARISON:  MRI of October 07, 2017. FINDINGS: Two intraoperative fluoroscopic images were obtained of the lower lumbar spine. Previously noted posterior fusion of L3-4 is noted with bilateral intrapedicular screw placement and interbody fusion. Interval placement of bilateral intrapedicular screws are noted at L5. IMPRESSION: Status post surgical posterior fusion of L3-4 and L4-5. Electronically Signed   By: Marijo Conception, M.D.   On: 10/12/2017 14:26    Scheduled Meds: . [MAR Hold] ALPRAZolam  0.25 mg Oral BID  . [MAR Hold] aspirin  81 mg Oral Daily  . [MAR Hold] atorvastatin  40 mg Oral QPM  . [MAR Hold] calcium-vitamin D  1 tablet  Oral BID  . [MAR Hold] escitalopram  20 mg Oral Daily  . [MAR Hold] gabapentin  100 mg Oral BID  . [MAR Hold] heparin  5,000 Units Subcutaneous Q8H  . [MAR Hold] lisinopril  10 mg Oral Daily   And  . [MAR Hold] hydrochlorothiazide  12.5 mg Oral Daily  . HYDROmorphone      . HYDROmorphone      . [MAR Hold] lidocaine  1 patch Transdermal Q24H  . [MAR Hold] metoprolol succinate  25 mg Oral Daily  . [MAR Hold] multivitamin with minerals  1 tablet Oral Daily  . [MAR Hold] pantoprazole  40 mg Oral BID  . [  MAR Hold] polyethylene glycol  17 g Oral Daily  . [MAR Hold] senna-docusate  2 tablet Oral BID    Continuous Infusions: . ciprofloxacin    . lactated ringers 10 mL/hr at 10/12/17 0950  . [MAR Hold] methocarbamol (ROBAXIN)  IV 500 mg (10/11/17 1928)     LOS: 7 days    Verneita Griffes, MD [facility] [phone]   To reach me or the doctor on call, go to: [email or web address] Password [email or web address]  10/12/2017, 3:56 PM

## 2017-10-12 NOTE — Progress Notes (Signed)
Subjective: The patient is alert and pleasant.  She complains of back and bilateral leg pain.  She wants to proceed with surgery.  Objective: Vital signs in last 24 hours: Temp:  [97.8 F (36.6 C)-98.8 F (37.1 C)] 97.8 F (36.6 C) (07/03 0416) Pulse Rate:  [83-90] 83 (07/03 0416) Resp:  [14-17] 14 (07/03 0416) BP: (108-135)/(41-59) 135/59 (07/03 0416) SpO2:  [92 %-98 %] 98 % (07/03 0416) Estimated body mass index is 53.23 kg/m as calculated from the following:   Height as of this encounter: 4\' 9"  (1.448 m).   Weight as of this encounter: 111.6 kg (246 lb).   Intake/Output from previous day: 07/02 0701 - 07/03 0700 In: -  Out: 401 [Urine:400; Stool:1] Intake/Output this shift: No intake/output data recorded.  Physical exam the patient is alert and pleasant.  She is moving her lower extremities well.  Lab Results: Recent Labs    10/10/17 0641  WBC 6.5  HGB 12.6  HCT 38.4  PLT 220   BMET Recent Labs    10/10/17 0641  NA 135  K 3.6  CL 100  CO2 28  GLUCOSE 112*  BUN 15  CREATININE 0.72  CALCIUM 8.9    Studies/Results: No results found.  Assessment/Plan: Lumbar pseudoarthrosis, lumbar fracture, lumbago, lumbar radiculopathy: I have again discussed the situation with the patient.  We discussed the various treatment options including surgery.  I have answered all her questions regarding an exploration of her lumbar fusion with a number instrumentation and possible extension of her fusion.  She wants to proceed with surgery.  LOS: 7 days     Michele Mcgee 10/12/2017, 8:32 AM

## 2017-10-12 NOTE — Progress Notes (Signed)
PT Cancellation Note  Patient Details Name: Michele Mcgee MRN: [id number] DOB: 03-Jul-1945   Cancelled Treatment:     back surgery today   Pt has been evaluated with rec for SNF and will cont to Tx during her acute stay.   Nathanial Rancher 10/12/2017, 1:09 PM

## 2017-10-12 NOTE — Progress Notes (Signed)
Family is visiting

## 2017-10-12 NOTE — Anesthesia Preprocedure Evaluation (Addendum)
Anesthesia Evaluation  Patient identified by MRN, date of birth, ID band Patient awake    Reviewed: Allergy & Precautions, H&P , NPO status , Patient's Chart, lab work & pertinent test results, reviewed documented beta blocker date and time   History of Anesthesia Complications (+) PONV and history of anesthetic complications  Airway Mallampati: III  TM Distance: >3 FB Neck ROM: full    Dental  (+) Edentulous Upper, Poor Dentition, Missing, Partial Lower   Pulmonary neg pulmonary ROS,    Pulmonary exam normal breath sounds clear to auscultation       Cardiovascular Exercise Tolerance: Good hypertension, Pt. on medications and Pt. on home beta blockers + CAD   Rhythm:regular Rate:Normal     Neuro/Psych Anxiety negative neurological ROS  negative psych ROS   GI/Hepatic Neg liver ROS, GERD  Medicated and Controlled,  Endo/Other  negative endocrine ROS  Renal/GU negative Renal ROS     Musculoskeletal  (+) Arthritis ,   Abdominal   Peds  Hematology negative hematology ROS (+)   Anesthesia Other Findings Obesity.  Reproductive/Obstetrics negative OB ROS                             Lab Results  Component Value Date   WBC 6.5 10/10/2017   HGB 12.6 10/10/2017   HCT 38.4 10/10/2017   MCV 92.1 10/10/2017   PLT 220 10/10/2017   Lab Results  Component Value Date   CREATININE 0.72 10/10/2017   BUN 15 10/10/2017   NA 135 10/10/2017   K 3.6 10/10/2017   CL 100 10/10/2017   CO2 28 10/10/2017   Nuclear stress 09/30/16 ([place]): LVEF= 68% FINDINGS: Regional wall motion:reveals normal myocardial thickening and wall motion. The overall quality of the study is good. Artifacts noted: no Left ventricular cavity: normal. Perfusion Analysis:SPECT images demonstrate homogeneous tracer  distribution throughout the myocardium. RESULT IMPRESSION:  Negative ETT.LV function  normal.No evidence of ischemia.  Echo 09/23/16 ([place]):  INTERPRETATION NORMAL LEFT VENTRICULAR SYSTOLIC FUNCTION NORMAL RIGHT VENTRICULAR SYSTOLIC FUNCTION MILD VALVULAR REGURGITATION (See above) NO VALVULAR STENOSIS Closest EF: >55% (Estimated) Mitral: TRIVIAL MR Tricuspid: MILD TR Aortic: TRIVIAL AR    Anesthesia Physical  Anesthesia Plan  ASA: IV  Anesthesia Plan: General   Post-op Pain Management:    Induction:   PONV Risk Score and Plan: 4 or greater and Dexamethasone, Ondansetron, Treatment may vary due to age or medical condition and Propofol infusion  Airway Management Planned: Video Laryngoscope Planned and Oral ETT  Additional Equipment:   Intra-op Plan:   Post-operative Plan: Extubation in OR  Informed Consent: I have reviewed the patients History and Physical, chart, labs and discussed the procedure including the risks, benefits and alternatives for the proposed anesthesia with the patient or authorized representative who has indicated his/her understanding and acceptance.   Dental Advisory Given  Plan Discussed with: CRNA  Anesthesia Plan Comments:         Anesthesia Quick Evaluation

## 2017-10-12 NOTE — Progress Notes (Signed)
Subjective: The patient is alert and pleasant.  She is in no apparent distress.  She looks well.  Objective: Vital signs in last 24 hours: Temp:  [97.8 F (36.6 C)-98.4 F (36.9 C)] 97.9 F (36.6 C) (07/03 1443) Pulse Rate:  [83-90] 86 (07/03 1507) Resp:  [14-23] 22 (07/03 1507) BP: (103-135)/(31-59) 107/46 (07/03 1455) SpO2:  [93 %-98 %] 98 % (07/03 1507) Weight:  [111.6 kg (246 lb)] 111.6 kg (246 lb) (07/03 0947) Estimated body mass index is 53.23 kg/m as calculated from the following:   Height as of this encounter: 4\' 9"  (1.448 m).   Weight as of this encounter: 111.6 kg (246 lb).   Intake/Output from previous day: 07/02 0701 - 07/03 0700 In: -  Out: 401 [Urine:400; Stool:1] Intake/Output this shift: Total I/O In: 1400 [I.V.:1400] Out: 900 [Urine:650; Blood:250]  Physical exam the patient is alert and pleasant.  She is moving her lower extremities well.  Lab Results: Recent Labs    10/10/17 0641  WBC 6.5  HGB 12.6  HCT 38.4  PLT 220   BMET Recent Labs    10/10/17 0641  NA 135  K 3.6  CL 100  CO2 28  GLUCOSE 112*  BUN 15  CREATININE 0.72  CALCIUM 8.9    Studies/Results: Dg Lumbar Spine 2-3 Views  Result Date: 10/12/2017 CLINICAL DATA:  Revision of L3-4 posterior fusion. EXAM: DG C-ARM 61-120 MIN; LUMBAR SPINE - 2-3 VIEW FLUOROSCOPY TIME:  1 minutes 0 seconds. COMPARISON:  MRI of October 07, 2017. FINDINGS: Two intraoperative fluoroscopic images were obtained of the lower lumbar spine. Previously noted posterior fusion of L3-4 is noted with bilateral intrapedicular screw placement and interbody fusion. Interval placement of bilateral intrapedicular screws are noted at L5. IMPRESSION: Status post surgical posterior fusion of L3-4 and L4-5. Electronically Signed   By: Marijo Conception, M.D.   On: 10/12/2017 14:26   Dg C-arm 1-60 Min  Result Date: 10/12/2017 CLINICAL DATA:  Revision of L3-4 posterior fusion. EXAM: DG C-ARM 61-120 MIN; LUMBAR SPINE - 2-3 VIEW  FLUOROSCOPY TIME:  1 minutes 0 seconds. COMPARISON:  MRI of October 07, 2017. FINDINGS: Two intraoperative fluoroscopic images were obtained of the lower lumbar spine. Previously noted posterior fusion of L3-4 is noted with bilateral intrapedicular screw placement and interbody fusion. Interval placement of bilateral intrapedicular screws are noted at L5. IMPRESSION: Status post surgical posterior fusion of L3-4 and L4-5. Electronically Signed   By: Marijo Conception, M.D.   On: 10/12/2017 14:26    Assessment/Plan: The patient is doing well.  I spoke with the family.  LOS: 7 days     Ophelia Charter 10/12/2017, 3:20 PM

## 2017-10-12 NOTE — Transfer of Care (Signed)
Immediate Anesthesia Transfer of Care Note  Patient: Michele Mcgee  Procedure(s) Performed: REVISION OF LUMBAR HARDWARE LUMBAR THREE-FOUR (N/A Spine Lumbar)  Patient Location: PACU  Anesthesia Type:General  Level of Consciousness: drowsy and patient cooperative  Airway & Oxygen Therapy: Patient Spontanous Breathing and Patient connected to nasal cannula oxygen  Post-op Assessment: Report given to RN, Post -op Vital signs reviewed and stable and Patient moving all extremities X 4  Post vital signs: Reviewed and stable  Last Vitals:  Vitals Value Taken Time  BP 103/31 10/12/2017  2:46 PM  Temp    Pulse 82 10/12/2017  2:49 PM  Resp 23 10/12/2017  2:49 PM  SpO2 94 % 10/12/2017  2:49 PM  Vitals shown include unvalidated device data.  Last Pain:  Vitals:   10/12/17 0910  TempSrc:   PainSc: 0-No pain      Patients Stated Pain Goal: 3 (38/88/75 7972)  Complications: No apparent anesthesia complications

## 2017-10-12 NOTE — Progress Notes (Signed)
Pharmacy Antibiotic Note  Michele Mcgee is a 72 y.o. female with history of L3-L4 surgery about 3 weeks PTA.  She is admitted on 10/05/2017 s/p fall and underwent repeat surgery on 10/12/17.  Patient has a drain and Pharmacy has been consulted for vancomycin dosing for surgical prophylaxis.  She is also on Cipro for possible UTI.  Per PACU RN, patient received Cipro at 1147, vancomycin 1gm IV at 1149 and vancomycin powder at 1424.  SCr 0.72, CrCL 67 ml/min, afebrile, WBC WNL.   Plan: Vanc 750mg  IV Q12H Cipro 400mg  IV Q12H per MD Monitor renal fxn, micro data, vanc trough pending LOT   Height: 4\' 9"  (144.8 cm) Weight: 242 lb 4.6 oz (109.9 kg) IBW/kg (Calculated) : 38.6  Temp (24hrs), Avg:97.8 F (36.6 C), Min:97.3 F (36.3 C), Max:98.4 F (36.9 C)  Recent Labs  Lab 10/05/17 1810 10/06/17 0030 10/10/17 0641  WBC 7.3 8.1 6.5  CREATININE 0.67 0.71 0.72    Estimated Creatinine Clearance: 67.3 mL/min (by C-G formula based on SCr of 0.72 mg/dL).    Allergies  Allergen Reactions  . Oxycodone   . Ampicillin Rash    Has patient had a PCN reaction causing immediate rash, facial/tongue/throat swelling, SOB or lightheadedness with hypotension: Rash Has patient had a PCN reaction causing severe rash involving mucus membranes or skin necrosis: No Has patient had a PCN reaction that required hospitalization: No Has patient had a PCN reaction occurring within the last 10 years: No If all of the above answers are "NO", then may proceed with Cephalosporin use.     Vanc 7/3 >> Cipro 7/3 >>  7/2 surgical screen - negative 7/3 UCx -    Michele Mcgee, PharmD, BCPS, [place] 10/12/2017, 5:38 PM

## 2017-10-12 NOTE — Op Note (Signed)
Brief history: The patient is a 72 year old morbidly obese white female on whom I performed an L3-4 decompression, instrumentation and fusion about 4 weeks ago.  Initially she did well.  She was transferred to a skilled nursing facility.  He was making progress but took a fall about 2 weeks ago.  Since then she has had severe back pain and pain into her left greater and right leg.  Is failed medical management.  She was worked up with a lumbar CT and a lumbar MRI.  This demonstrated findings consistent with an L4 fracture and loosening of the L4 screws.  I discussed the various treatment options with the patient and her family including surgery.  She has weighed the risk, benefits, and alternatives to surgery and decided to proceed with an exploration of her lumbar fusion with revision of her lumbar hardware and extension of her lumbar fusion.  Preop diagnosis: Lumbar fracture, lumbar pseudoarthrosis, lumbago, lumbar radiculopathy  Postop diagnosis: The same  Procedure: Exploration of lumbar fusion, removal of lumbar hardware, L3-4 and L4-5 redo posterior lateral arthrodesis; L3-4 and L4-5 posterior segmental instrumentation with globus fenestrated pedicle screws; L3, L4 and L5 vertebroplasty;  Surgeon: Dr. Earle Gell  Assistant: None  Anesthesia: General tracheal  Estimated blood loss: 250 cc  Specimens: None  Drains: One medium Hemovac in the epidural space  Complications: None  Description of procedure: The patient was brought to the operating room by the anesthesia team.  General endotracheal anesthesia was induced.  A Foley catheter was placed and purulent urine was noted.  She was given IV vancomycin and Cipro.  The patient was turned to the prone position on the Wilson frame.  The patient's lumbosacral region was then prepared with Betadine scrub and Betadine solution.  Sterile drapes were applied.  I then sized the patient's previous surgical scar with a scalpel.  I used  electrocautery to perform a subperiosteal dissection closing the L3, L4 and L5 spinous process and the old hardware.  We then remove the caps from the old screws, then remove the rod, then inspected the screws.  The screws were definitely loose at L4 but look good at L3.  I carefully dissected in the epidural space exposing the thecal sac and the bilateral L4 nerve roots.  I did not see any neural compression.  We now turned our attention to the redo instrumentation.  I placed 8.5 x 45 mm fenestrated screws in the L 3 and L4 pedicles using the old screw holes at L3.  I redirected the screws a bit more medial and L4 to get better bony purchase.  The screws at L4 did not seem optimal despite this.  I therefore decided to perform a posterior lateral arthrodesis at L4-5 as well.  I cannulated the bilateral L5 pedicles with a bone probe.  We attempted to use fluoroscopy to help with placement of the screws but it was of limited use given the patient's body habitus.  I tapped the L5 pedicles with a 6.5 mm tap and inserted 7.5 x 45 and 50 mm pedicle screws and the L5 pedicles bilaterally.  We got bony purchase in the bilateral L3 and the L5 pedicle roots.  In using suboptimal fluoroscopy injected bone cement through the fenestrated screws bilaterally at L3, L4 and L5.  We then connected the unilateral pedicle screws with a lordotic rod.  We secured the rod in place with the caps.  This completed the instrumentation.  We now turned our attention to the arthrodesis.  We corticated the transverse process at L3-4 and L4-5.  We laid a combination of local morselized autograft bone that we obtained during the decompression and Kinnex bone graft extender over the facets and there is process bilaterally at L3-4 and L4-5 completing the posterior lateral arthrodesis.  We obtained hemostasis using bipolar cautery.  We irrigated the wound that with bacitracin solution.  We then placed a medium Hemovac drain in the epidural  space and tunneled it out through a separate stab wound.  We then removed the retractor and reapproximated patient's thoracolumbar fascia with interrupted #1 Vicryl suture.  We reapproximate the subcutaneous tissue with interrupted 2-0 Vicryl suture.  We reapproximated the skin with Steri-Strips and benzoin.  The wound was then coated with bacitracin ointment.  Sterile dressings were applied.  The drapes were removed.  The patient was then returned to the supine position.  By report all sponge, instrument, and needle counts were correct at the end of this case.

## 2017-10-12 NOTE — Progress Notes (Signed)
Pt did not want to order any food. Stated that her caffeine free diet coke is enough.

## 2017-10-13 LAB — COMPREHENSIVE METABOLIC PANEL
ALBUMIN: 2.7 g/dL — AB (ref 3.5–5.0)
ALT: 24 U/L (ref 0–44)
ANION GAP: 4 — AB (ref 5–15)
AST: 29 U/L (ref 15–41)
Alkaline Phosphatase: 75 U/L (ref 38–126)
BUN: 10 mg/dL (ref 8–23)
CO2: 29 mmol/L (ref 22–32)
Calcium: 8.3 mg/dL — ABNORMAL LOW (ref 8.9–10.3)
Chloride: 105 mmol/L (ref 98–111)
Creatinine, Ser: 0.7 mg/dL (ref 0.44–1.00)
GFR calc Af Amer: >60 mL/min (ref >60)
GFR calc non Af Amer: >60 mL/min (ref >60)
GLUCOSE: 97 mg/dL (ref 70–99)
POTASSIUM: 3.9 mmol/L (ref 3.5–5.1)
SODIUM: 138 mmol/L (ref 135–145)
Total Bilirubin: 0.8 mg/dL (ref 0.3–1.2)
Total Protein: 5 g/dL — ABNORMAL LOW (ref 6.5–8.1)

## 2017-10-13 LAB — CBC WITH DIFFERENTIAL/PLATELET
Abs Immature Granulocytes: 0.1 10*3/uL (ref 0.0–0.1)
BASOS ABS: 0 10*3/uL (ref 0.0–0.1)
Basophils Relative: 1 %
EOS ABS: 0 10*3/uL (ref 0.0–0.7)
EOS PCT: 0 %
HEMATOCRIT: 32.9 % — AB (ref 36.0–46.0)
Hemoglobin: 10.3 g/dL — ABNORMAL LOW (ref 12.0–15.0)
IMMATURE GRANULOCYTES: 1 %
LYMPHS ABS: 1.1 10*3/uL (ref 0.7–4.0)
LYMPHS PCT: 12 %
MCH: 29.6 pg (ref 26.0–34.0)
MCHC: 31.3 g/dL (ref 30.0–36.0)
MCV: 94.5 fL (ref 78.0–100.0)
Monocytes Absolute: 1.1 10*3/uL — ABNORMAL HIGH (ref 0.1–1.0)
Monocytes Relative: 12 %
NEUTROS PCT: 74 %
Neutro Abs: 6.6 10*3/uL (ref 1.7–7.7)
Platelets: 172 10*3/uL (ref 150–400)
RBC: 3.48 MIL/uL — AB (ref 3.87–5.11)
RDW: 13.5 % (ref 11.5–15.5)
WBC: 8.8 10*3/uL (ref 4.0–10.5)

## 2017-10-13 MED ORDER — LACTULOSE 10 GM/15ML PO SOLN
10.0000 g | Freq: Every day | ORAL | Status: DC
  Administered 2017-10-13 – 2017-10-14 (×2): 10 g via ORAL
  Filled 2017-10-13 (×2): qty 15

## 2017-10-13 NOTE — Progress Notes (Signed)
PROGRESS NOTE  Michele Mcgee FHL:[id number] DOB: Apr 26, 1945 DOA: 10/05/2017 PCP: Kirk Ruths, MD  HPI/Recap of past 24 hours: 72 fem hypertension, CAD and recent lumbar fusion at L3-4 weeks ago secondary to lumbar spinal stenosis and radiculopathy who was at rehab and then had a fall landing on her left side.    severe pain in her left upper thigh down to her foot most on the lateral side 10/10.  Unable to stand.  Seen on 6/25 with a negative CT of the hip, but pain persisted so she was reevaluated in the emergency room the night of 6/26 at [facility].   CT of the lumbar spine was done showing questionable hardware loosening and fracture of the lateral vertebral body cortex.  Emergency room physician spoke with neurosurgery who advised patient to be transferred over to [facility].  Patient was admitted and is currently stable.  Seen by neurosurgery who is ordered an MRI of the hip and spine.    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    Patient's MRI no evidence of hip fracture, but does note strain and low-grade partial tear of left gluteus maximus myotendinous junction and of the left hamstring.  Case discussed with Orthopedic surgery who recommended pain control and physical therapy.    Neuro surgery followed up and after reviewing MRI, found no evidence of neurocompression and initially recommended physical therapy.  Over the next few days, patient has had significant issues with pain.  Initially this was involving the left leg requiring adjustments in pain medication and Lidoderm patch.  Unfortunately, it has reached the point where patient is unable to even stand for any period of time.  Starting on the evening of 7/1, patient started having pain in her right leg.    Neurosurgery followed up after discussion with  daughter and patient, plan to take patient back to the OR on 7/3 for expiration of lumbar wound and revision of lumbar hardware with possible kyphoplasty and/or possible extension of lumbar fusion/instrumentation.  Patient's hospital course also complicated by significant constipation.  She responded well to lactulose on 7/1 and had multiple bowel movements.  Today she is feeling better.  Still some back pain which can feel with pain going down both legs.  She does however feel more calm.  Assessment/Plan: Principal Problem:   Fall with partial tears of left gluteus maximus myotendinous junction and left hamstring:  orthopedic attending  recommended pain control and PT per my partner PRN Percocet, continued on IV as needed muscle relaxer, morphine and lidocaine patch.   Discontinued Dilaudid p.o. Continue oral opiates and IV morphine as needed  Recent lumbar fusion with recurrent back pain and lower leg pain bilaterally:  Revision of surgery 7/3-NS following and appreciated Postop care as per neurosurgeon    Coronary artery disease: Stablev  Anxiety/depression: Stable, no suicidal ideations.  Continue home medications.  Patient gets severely anxious and distressed.  Increased her Xanax to 0.25 p.o. twice daily which has improved her mood.    Essential hypertension: Continue antihypertensive medications, blood pressure well controlled on pressure    Morbid obesity ([place]): Patient meets criteria BMI greater than 50--super-morbid obeseity and life-threatening  Constipation: In part from dehydration as well as from opiate medications.  After 1 week of constipation, responded well with lactulose.  Has started daily  MiraLAX scheduled Continue Senokot in addition   sciatica:  From prolonged immobilization.  Some relief with lidocaine patch.  Once patient is more ambulatory, this will continue to improve.   Code Status: Full code  Family Communication: No family currently  present  Disposition Plan: Dependent on neurosurgeon and progress with the same--will probably need skilled care once PT OT can assess    Consultants:  Dr. Miguel Rota   case discussed with Hilbert Odor, ortho PA and Dr. Delfino Lovett, orthopedic surgery  Procedures:  7/3: Planned lumbar revision  Antimicrobials:  None  DVT prophylaxis: Subcu heparin   Objective: Vitals:   10/12/17 1709 10/13/17 0050 10/13/17 0341 10/13/17 0821  BP: (!) 131/97 (!) 105/44 (!) 107/43 (!) 122/51  Pulse: 96   93  Resp:      Temp: 97.7 F (36.5 C) 97.6 F (36.4 C) 97.8 F (36.6 C) 97.9 F (36.6 C)  TempSrc: Oral Oral Oral Oral  SpO2: 98% 97% 98% 99%  Weight:      Height:        Intake/Output Summary (Last 24 hours) at 10/13/2017 1058 Last data filed at 10/13/2017 0828 Gross per 24 hour  Intake 1887.46 ml  Output 1640 ml  Net 247.46 ml   Vitals:   10/13/17 0341 10/13/17 0821  BP: (!) 107/43 (!) 122/51  Pulse:  93  Resp:    Temp: 97.8 F (36.6 C) 97.9 F (36.6 C)  SpO2: 98% 99%    Exam:  Awake alert no distress laughing with nurse techs no new issue pain is not severe but she is very unsteady   Exam eomi ncat ovbese thicki neck s1 s 2no m/r/g abd soft obese nt, nd No le edema Vac in place Moves limbs x4 Skin no other deficit   Data Reviewed: CBC: Recent Labs  Lab 10/10/17 0641 10/13/17 0422  WBC 6.5 8.8  NEUTROABS  --  6.6  HGB 12.6 10.3*  HCT 38.4 32.9*  MCV 92.1 94.5  PLT 220 016   Basic Metabolic Panel: Recent Labs  Lab 10/10/17 0641 10/13/17 0422  NA 135 138  K 3.6 3.9  CL 100 105  CO2 28 29  GLUCOSE 112* 97  BUN 15 10  CREATININE 0.72 0.70  CALCIUM 8.9 8.3*   GFR: Estimated Creatinine Clearance: 67.3 mL/min (by C-G formula based on SCr of 0.7 mg/dL). Liver Function Tests: Recent Labs  Lab 10/13/17 0422  AST 29  ALT 24  ALKPHOS 75  BILITOT 0.8  PROT 5.0*  ALBUMIN 2.7*   No results for input(s): LIPASE, AMYLASE in the last  168 hours. No results for input(s): AMMONIA in the last 168 hours. Coagulation Profile: No results for input(s): INR, PROTIME in the last 168 hours. Cardiac Enzymes: No results for input(s): CKTOTAL, CKMB, CKMBINDEX, TROPONINI in the last 168 hours. BNP (last 3 results) No results for input(s): PROBNP in the last 8760 hours. HbA1C: No results for input(s): HGBA1C in the last 72 hours. CBG: No results for input(s): GLUCAP in the last 168 hours. Lipid Profile: No results for input(s): CHOL, HDL, LDLCALC, TRIG, CHOLHDL, LDLDIRECT in the last 72 hours. Thyroid Function Tests: No results for input(s): TSH, T4TOTAL, FREET4, T3FREE, THYROIDAB in the last 72 hours. Anemia Panel: No results for input(s): VITAMINB12, FOLATE, FERRITIN, TIBC, IRON, RETICCTPCT in the last 72 hours. Urine analysis:    Component Value Date/Time   COLORURINE YELLOW 10/12/2017 1147   APPEARANCEUR TURBID (A) 10/12/2017 1147   LABSPEC RESULTS UNAVAILABLE DUE TO INTERFERING  SUBSTANCE 10/12/2017 1147   PHURINE RESULTS UNAVAILABLE DUE TO INTERFERING SUBSTANCE 10/12/2017 1147   GLUCOSEU RESULTS UNAVAILABLE DUE TO INTERFERING SUBSTANCE (A) 10/12/2017 1147   HGBUR RESULTS UNAVAILABLE DUE TO INTERFERING SUBSTANCE (A) 10/12/2017 1147   BILIRUBINUR RESULTS UNAVAILABLE DUE TO INTERFERING SUBSTANCE (A) 10/12/2017 1147   KETONESUR RESULTS UNAVAILABLE DUE TO INTERFERING SUBSTANCE (A) 10/12/2017 1147   PROTEINUR RESULTS UNAVAILABLE DUE TO INTERFERING SUBSTANCE (A) 10/12/2017 1147   NITRITE RESULTS UNAVAILABLE DUE TO INTERFERING SUBSTANCE (A) 10/12/2017 1147   LEUKOCYTESUR RESULTS UNAVAILABLE DUE TO INTERFERING SUBSTANCE (A) 10/12/2017 1147   Sepsis Labs: @LABRCNTIP (procalcitonin:4,lacticidven:4)  ) Recent Results (from the past 240 hour(s))  Surgical pcr screen     Status: None   Collection Time: 10/11/17  9:16 PM  Result Value Ref Range Status   MRSA, PCR NEGATIVE NEGATIVE Final   Staphylococcus aureus NEGATIVE NEGATIVE  Final    Comment: (NOTE) The Xpert SA Assay (FDA approved for NASAL specimens in patients 78 years of age and older), is one component of a comprehensive surveillance program. It is not intended to diagnose infection nor to guide or monitor treatment. Performed at [facility], [place] [address] [place], [place] [address]   Urine Culture     Status: Abnormal (Preliminary result)   Collection Time: 10/12/17 11:47 AM  Result Value Ref Range Status   Specimen Description URINE, CATHETERIZED  Final   Special Requests   Final    NONE Performed at [facility], [place] [address], [place], [place] [address]    Culture >=100,000 COLONIES/mL GRAM NEGATIVE RODS (A)  Final   Report Status PENDING  Incomplete      Studies: Dg Lumbar Spine 2-3 Views  Result Date: 10/12/2017 CLINICAL DATA:  Revision of L3-4 posterior fusion. EXAM: DG C-ARM 61-120 MIN; LUMBAR SPINE - 2-3 VIEW FLUOROSCOPY TIME:  1 minutes 0 seconds. COMPARISON:  MRI of October 07, 2017. FINDINGS: Two intraoperative fluoroscopic images were obtained of the lower lumbar spine. Previously noted posterior fusion of L3-4 is noted with bilateral intrapedicular screw placement and interbody fusion. Interval placement of bilateral intrapedicular screws are noted at L5. IMPRESSION: Status post surgical posterior fusion of L3-4 and L4-5. Electronically Signed   By: Marijo Conception, M.D.   On: 10/12/2017 14:26   Dg C-arm 1-60 Min  Result Date: 10/12/2017 CLINICAL DATA:  Revision of L3-4 posterior fusion. EXAM: DG C-ARM 61-120 MIN; LUMBAR SPINE - 2-3 VIEW FLUOROSCOPY TIME:  1 minutes 0 seconds. COMPARISON:  MRI of October 07, 2017. FINDINGS: Two intraoperative fluoroscopic images were obtained of the lower lumbar spine. Previously noted posterior fusion of L3-4 is noted with bilateral intrapedicular screw placement and interbody fusion. Interval placement of bilateral intrapedicular screws are noted at L5. IMPRESSION: Status post surgical posterior  fusion of L3-4 and L4-5. Electronically Signed   By: Marijo Conception, M.D.   On: 10/12/2017 14:26    Scheduled Meds: . acetaminophen  1,000 mg Oral Q6H  . ALPRAZolam  0.25 mg Oral BID  . atorvastatin  40 mg Oral QPM  . calcium-vitamin D  1 tablet Oral BID  . docusate sodium  100 mg Oral BID  . escitalopram  20 mg Oral Daily  . gabapentin  100 mg Oral BID  . lisinopril  10 mg Oral Daily   And  . hydrochlorothiazide  12.5 mg Oral Daily  . metoprolol succinate  25 mg Oral Daily  . multivitamin with minerals  1 tablet Oral Daily  .  pantoprazole  40 mg Oral BID  . polyethylene glycol  17 g Oral Daily  . senna-docusate  2 tablet Oral BID  . sodium chloride flush  3 mL Intravenous Q12H    Continuous Infusions: . sodium chloride 10 mL/hr at 10/13/17 0300  . ciprofloxacin 400 mg (10/12/17 2333)  . vancomycin 750 mg (10/13/17 0913)     LOS: 8 days    Verneita Griffes, MD [facility] [phone]   To reach me or the doctor on call, go to: [email or web address] Password [email or web address]  10/13/2017, 10:58 AM

## 2017-10-13 NOTE — Evaluation (Signed)
Occupational Therapy Evaluation Patient Details Name: Michele Mcgee MRN: [id number] DOB: [birth year] Today's Date: 10/13/2017    History of Present Illness 72 year old female with past medical history of hypertension, CAD and recent lumbar fusion at L3-4 weeks ago secondary to lumbar spinal stenosis and radiculopathy who was at rehab and then had a fall landing on her left side.  MRI showed partial tears of left gluteus maximus myotendinous junction and left hamstring.  Now s/p surgical revision of lumbar fusion and vertebroplasty L 3, L4, L5 10/12/17.   Clinical Impression   Pt admitted with above. She demonstrates the below listed deficits and will benefit from continued OT to maximize safety and independence with BADLs.  Pt presents to OT with generalized weakness, impaired balance, decreased activity tolerance.  She currently requires min - total A for ADLs, and mod A +2 for functional mobility.  Recommend SNF level rehab at discharge.       Follow Up Recommendations  SNF;Supervision/Assistance - 24 hour    Equipment Recommendations  None recommended by OT    Recommendations for Other Services       Precautions / Restrictions Precautions Precautions: Fall;Back Precaution Booklet Issued: No Precaution Comments: Pt able to verbalize 3/3 back precautions, but requires min A to adhere to them  Required Braces or Orthoses: Spinal Brace Spinal Brace: Thoracolumbosacral orthotic;Applied in sitting position      Mobility Bed Mobility Overal bed mobility: Needs Assistance Bed Mobility: Rolling;Sidelying to Sit;Sit to Sidelying Rolling: Min assist Sidelying to sit: Mod assist     Sit to sidelying: Mod assist General bed mobility comments: assist to lift trunk and to lift LEs back onto bed.  Mod cues for sequencing   Transfers Overall transfer level: Needs assistance Equipment used: Rolling walker (2 wheeled) Transfers: Sit to/from Stand Sit to Stand: Mod assist          General transfer comment: asisst to boost into standing and assist to steady     Balance Overall balance assessment: Needs assistance Sitting-balance support: Bilateral upper extremity supported;Feet supported Sitting balance-Leahy Scale: Poor Sitting balance - Comments: requires min A- min guard assist  to maintain EOB sitting  Postural control: Posterior lean Standing balance support: Bilateral upper extremity supported Standing balance-Leahy Scale: Poor Standing balance comment: UE support and mod A to maintain standing.  Pt has poor awareness of where she is in space and requires cues to weight shift anteriorly and posteriorly                            ADL either performed or assessed with clinical judgement   ADL Overall ADL's : Needs assistance/impaired Eating/Feeding: Set up;Sitting   Grooming: Wash/dry hands;Wash/dry face;Oral care;Brushing hair;Set up;Sitting   Upper Body Bathing: Minimal assistance;Sitting   Lower Body Bathing: Maximal assistance;Sit to/from stand   Upper Body Dressing : Moderate assistance;Sitting   Lower Body Dressing: Total assistance;Sit to/from stand   Toilet Transfer: Moderate assistance;Stand-pivot;BSC;RW;+2 for physical assistance   Toileting- Clothing Manipulation and Hygiene: Total assistance;Sit to/from stand       Functional mobility during ADLs: Moderate assistance;+2 for physical assistance       Vision Baseline Vision/History: Wears glasses Wears Glasses: At all times Patient Visual Report: No change from baseline Vision Assessment?: No apparent visual deficits     Perception     Praxis      Pertinent Vitals/Pain Pain Assessment: No/denies pain     Hand Dominance Right  Extremity/Trunk Assessment Upper Extremity Assessment Upper Extremity Assessment: Generalized weakness   Lower Extremity Assessment Lower Extremity Assessment: Defer to PT evaluation       Communication Communication Communication:  No difficulties   Cognition Arousal/Alertness: Awake/alert Behavior During Therapy: Anxious Overall Cognitive Status: Impaired/Different from baseline                                 General Comments: slow processing;requiring multiple repetitions of instructions   General Comments  Pt requires max A to don brace in sitting     Exercises     Shoulder Instructions      Home Living Family/patient expects to be discharged to:: Skilled nursing facility                             Home Equipment: Gilford Rile - 2 wheels;Cane - single point;Shower seat;Grab bars - tub/shower;Adaptive equipment Adaptive Equipment: Reacher;Sock aid;Long-handled shoe horn;Long-handled sponge        Prior Functioning/Environment Level of Independence: Needs assistance  Gait / Transfers Assistance Needed: Was ambulating with RW at ALF ADL's / Homemaking Assistance Needed: Pt reports she had progressed to mod I for ADLs, but required assist for IADLs and was scheduled for discharge from SNF when she fell             OT Problem List: Decreased strength;Decreased activity tolerance;Impaired balance (sitting and/or standing);Decreased safety awareness;Decreased knowledge of use of DME or AE;Decreased knowledge of precautions;Obesity      OT Treatment/Interventions: Self-care/ADL training;DME and/or AE instruction;Therapeutic activities;Patient/family education;Balance training    OT Goals(Current goals can be found in the care plan section) Acute Rehab OT Goals Patient Stated Goal: "I just want to get back where I was so I can go home"  OT Goal Formulation: With patient Time For Goal Achievement: 10/27/17 Potential to Achieve Goals: Good ADL Goals Pt Will Perform Grooming: with min assist;standing Pt Will Perform Upper Body Bathing: with set-up;with supervision;sitting Pt Will Perform Lower Body Dressing: with min assist;with adaptive equipment;sit to/from stand Pt Will Transfer  to Toilet: with min assist;ambulating;regular height toilet;bedside commode;grab bars Pt Will Perform Toileting - Clothing Manipulation and hygiene: with min assist;sit to/from stand  OT Frequency: Min 2X/week   Barriers to D/C: Decreased caregiver support          Co-evaluation              AM-PAC PT "6 Clicks" Daily Activity     Outcome Measure Help from another person eating meals?: None Help from another person taking care of personal grooming?: A Little Help from another person toileting, which includes using toliet, bedpan, or urinal?: A Lot Help from another person bathing (including washing, rinsing, drying)?: A Lot Help from another person to put on and taking off regular upper body clothing?: A Lot Help from another person to put on and taking off regular lower body clothing?: Total 6 Click Score: 14   End of Session Equipment Utilized During Treatment: Rolling walker;Back brace Nurse Communication: Mobility status  Activity Tolerance: Patient limited by fatigue Patient left: in bed;with call bell/phone within reach;with family/visitor present  OT Visit Diagnosis: Unsteadiness on feet (R26.81)                Time: 6073-7106 OT Time Calculation (min): 24 min Charges:  OT General Charges $OT Visit: 1 Visit OT Evaluation $OT Eval Moderate Complexity: 1  Mod OT Treatments $Therapeutic Activity: 8-22 mins $Neuromuscular Re-education: 8-22 mins G-Codes:     [facility], OTR/L [phone]   Lucille Passy M 10/13/2017, 6:50 PM

## 2017-10-13 NOTE — Evaluation (Signed)
Physical Therapy Evaluation Patient Details Name: Michele Mcgee MRN: [id number] DOB: February 02, 1946 Today's Date: 10/13/2017   History of Present Illness  72 year old female with past medical history of hypertension, CAD and recent lumbar fusion at L3-4 weeks ago secondary to lumbar spinal stenosis and radiculopathy who was at rehab and then had a fall landing on her left side.  MRI showed partial tears of left gluteus maximus myotendinous junction and left hamstring.  Now s/p surgical revision of lumbar fusion and vertebroplasty L 3, L4, L5 10/12/17.  Clinical Impression  Patient presents with much improved mobility s/p surgical revision of fusion.  She remains limited by pain, decreased strength (esp core strength), decreased balance, decreased activity tolerance and safety awareness.  She will benefit from skilled PT in the acute setting to promote independence and safety prior to d/c to SNF level rehab.     Follow Up Recommendations SNF;Supervision/Assistance - 24 hour    Equipment Recommendations  None recommended by PT    Recommendations for Other Services       Precautions / Restrictions Precautions Precautions: Fall;Back Precaution Comments: Verbally reviewed BLT back precautions.  Required Braces or Orthoses: Spinal Brace Spinal Brace: Thoracolumbosacral orthotic;Applied in sitting position Restrictions Other Position/Activity Restrictions: Bobby from [employer] present during session to fit with TLSO      Mobility  Bed Mobility Overal bed mobility: Needs Assistance Bed Mobility: Rolling;Sidelying to Sit Rolling: Mod assist Sidelying to sit: Mod assist;+2 for physical assistance       General bed mobility comments: assisted legs off bed and trunk upright  Transfers Overall transfer level: Needs assistance Equipment used: Rolling walker (2 wheeled) Transfers: Sit to/from Stand Sit to Stand: +2 physical assistance;Mod assist         General transfer comment: lifting  help from EOB, pt with hands on walker; brace adjusted once in standing  Ambulation/Gait Ambulation/Gait assistance: Mod assist;+2 physical assistance Gait Distance (Feet): 10 Feet Assistive device: Rolling walker (2 wheeled) Gait Pattern/deviations: Step-to pattern;Decreased stride length;Shuffle     General Gait Details: difficulty keeping hips stable under her trunk, tendency to let them come forward and risk for posterior LOB without support and front and back of her hips  Stairs            Wheelchair Mobility    Modified Rankin (Stroke Patients Only)       Balance Overall balance assessment: Needs assistance Sitting-balance support: Feet unsupported Sitting balance-Leahy Scale: Fair Sitting balance - Comments: can sit unsupported, but fatigued sitting long time waiting for brace fitting, so leaned back into support Postural control: Posterior lean Standing balance support: Bilateral upper extremity supported Standing balance-Leahy Scale: Poor Standing balance comment: UE support and external core stabilization needed for balance                             Pertinent Vitals/Pain Faces Pain Scale: Hurts little more Pain Location: back with mobility Pain Descriptors / Indicators: Operative site guarding;Grimacing Pain Intervention(s): Monitored during session;Repositioned    Home Living Family/patient expects to be discharged to:: Skilled nursing facility Living Arrangements: Alone Available Help at Discharge: Family;Available 24 hours/day Type of Home: House Home Access: Stairs to enter Entrance Stairs-Rails: Right;Left;Can reach both Entrance Stairs-Number of Steps: 2 Home Layout: One level Home Equipment: Walker - 2 wheels;Cane - single point;Shower seat;Grab bars - tub/shower Additional Comments: pt has been at [facility] since back surgery, expect that she will need higher  level care after this hospitalization as daughter mentions that  she is expected to bathe and dress herself at [facility]    Prior Function Level of Independence: Needs assistance   Gait / Transfers Assistance Needed: Was ambulating with RW at ALF  ADL's / Homemaking Assistance Needed: needed assist        Hand Dominance        Extremity/Trunk Assessment        Lower Extremity Assessment Lower Extremity Assessment: Generalized weakness LLE Deficits / Details: AAROM Grossly WFL, fearful to move, but not painful today with movement    Cervical / Trunk Assessment Cervical / Trunk Assessment: Kyphotic  Communication   Communication: No difficulties  Cognition Arousal/Alertness: Awake/alert Behavior During Therapy: WFL for tasks assessed/performed Overall Cognitive Status: Within Functional Limits for tasks assessed                                        General Comments General comments (skin integrity, edema, etc.): family in the room; reviewed back precautions    Exercises General Exercises - Lower Extremity Ankle Circles/Pumps: AROM;10 reps;Both;Supine Heel Slides: AAROM;5 reps;Both;Supine   Assessment/Plan    PT Assessment Patient needs continued PT services  PT Problem List Decreased strength;Decreased range of motion;Decreased activity tolerance;Decreased balance;Decreased mobility;Decreased knowledge of use of DME;Decreased safety awareness;Decreased knowledge of precautions;Pain       PT Treatment Interventions DME instruction;Gait training;Therapeutic activities;Therapeutic exercise;Functional mobility training;Balance training;Patient/family education    PT Goals (Current goals can be found in the Care Plan section)  Acute Rehab PT Goals Patient Stated Goal: to get better PT Goal Formulation: With patient/family Time For Goal Achievement: 10/27/17 Potential to Achieve Goals: Good    Frequency Min 5X/week   Barriers to discharge        Co-evaluation               AM-PAC PT "6  Clicks" Daily Activity  Outcome Measure Difficulty turning over in bed (including adjusting bedclothes, sheets and blankets)?: Unable Difficulty moving from lying on back to sitting on the side of the bed? : Unable Difficulty sitting down on and standing up from a chair with arms (e.g., wheelchair, bedside commode, etc,.)?: Unable Help needed moving to and from a bed to chair (including a wheelchair)?: A Lot Help needed walking in hospital room?: A Lot Help needed climbing 3-5 steps with a railing? : Total 6 Click Score: 8    End of Session Equipment Utilized During Treatment: Gait belt;Back brace Activity Tolerance: Patient tolerated treatment well Patient left: with call bell/phone within reach;in chair;with family/visitor present Nurse Communication: Mobility status PT Visit Diagnosis: Unsteadiness on feet (R26.81);Pain;Other abnormalities of gait and mobility (R26.89) Pain - Right/Left: Left Pain - part of body: (back)    Time: 9509-3267 PT Time Calculation (min) (ACUTE ONLY): 36 min   Charges:   PT Evaluation $PT Re-evaluation: 1 Re-eval PT Treatments $Therapeutic Activity: 8-22 mins   PT G CodesMagda Kiel, [place] [phone] 10/13/2017   Reginia Naas 10/13/2017, 1:46 PM

## 2017-10-13 NOTE — Progress Notes (Signed)
Subjective: Patient resting in bed comfortably, following revision of lumbar fusion yesterday.  PT and OT to resume now in the postoperative period.  Moderate drainage into Hemovac drain (120 cc over past shift).  Objective: Vital signs in last 24 hours: Vitals:   10/12/17 1709 10/13/17 0050 10/13/17 0341 10/13/17 0821  BP: (!) 131/97 (!) 105/44 (!) 107/43 (!) 122/51  Pulse: 96   93  Resp:      Temp: 97.7 F (36.5 C) 97.6 F (36.4 C) 97.8 F (36.6 C) 97.9 F (36.6 C)  TempSrc: Oral Oral Oral Oral  SpO2: 98% 97% 98% 99%  Weight:      Height:        Intake/Output from previous day: 07/03 0701 - 07/04 0700 In: 1887.5 [P.O.:240; I.V.:1497.5; IV Piggyback:150] Out: 1520 [Urine:1200; Drains:70; Blood:250] Intake/Output this shift: Total I/O In: -  Out: 120 [Drains:120]  Physical Exam: Moving extremities well.  CBC Recent Labs    10/13/17 0422  WBC 8.8  HGB 10.3*  HCT 32.9*  PLT 172   BMET Recent Labs    10/13/17 0422  NA 138  K 3.9  CL 105  CO2 29  GLUCOSE 97  BUN 10  CREATININE 0.70  CALCIUM 8.3*   Studies/Results: Dg Lumbar Spine 2-3 Views  Result Date: 10/12/2017 CLINICAL DATA:  Revision of L3-4 posterior fusion. EXAM: DG C-ARM 61-120 MIN; LUMBAR SPINE - 2-3 VIEW FLUOROSCOPY TIME:  1 minutes 0 seconds. COMPARISON:  MRI of October 07, 2017. FINDINGS: Two intraoperative fluoroscopic images were obtained of the lower lumbar spine. Previously noted posterior fusion of L3-4 is noted with bilateral intrapedicular screw placement and interbody fusion. Interval placement of bilateral intrapedicular screws are noted at L5. IMPRESSION: Status post surgical posterior fusion of L3-4 and L4-5. Electronically Signed   By: Marijo Conception, M.D.   On: 10/12/2017 14:26   Dg C-arm 1-60 Min  Result Date: 10/12/2017 CLINICAL DATA:  Revision of L3-4 posterior fusion. EXAM: DG C-ARM 61-120 MIN; LUMBAR SPINE - 2-3 VIEW FLUOROSCOPY TIME:  1 minutes 0 seconds. COMPARISON:  MRI of October 07, 2017. FINDINGS: Two intraoperative fluoroscopic images were obtained of the lower lumbar spine. Previously noted posterior fusion of L3-4 is noted with bilateral intrapedicular screw placement and interbody fusion. Interval placement of bilateral intrapedicular screws are noted at L5. IMPRESSION: Status post surgical posterior fusion of L3-4 and L4-5. Electronically Signed   By: Marijo Conception, M.D.   On: 10/12/2017 14:26    Assessment/Plan: Stable first postoperative day.  PT and OT.  Will eventually need rehabilitation either in CIR or SNF setting.Hosie Spangle, MD 10/13/2017, 10:16 AM

## 2017-10-13 NOTE — Care Management Note (Signed)
Case Management Note  Patient Details  Name: Michele Mcgee MRN: [id number] Date of Birth: 1945/08/12  Subjective/Objective: Pt admitted on 10/05/17 s/p fall with LT leg pain.  Pt is s/p revision of lumbar fusion on 10/12/17.  PTA, pt was residing at [facility].                        Action/Plan: CSW consulted to facilitate return to SNF upon medical stability.    Expected Discharge Date:                  Expected Discharge Plan:  Skilled Nursing Facility(From [facility])  In-House Referral:  Clinical Social Work  Discharge planning Services  CM Consult  Post Acute Care Choice:    Choice offered to:     DME Arranged:    DME Agency:     HH Arranged:    [place] Agency:     Status of Service:  In process, will continue to follow  If discussed at Long Length of Stay Meetings, dates discussed:    Additional Comments:  Reinaldo Raddle, RN, BSN  Trauma/Neuro ICU Case Manager [phone]

## 2017-10-13 NOTE — Social Work (Signed)
CSW acknowledging pt transfer to [facility]. Aware pt from [facility], plan is to d/c there upon medical stability.   CSW will f/u with SNF for insurance auth and transfer.   [place], [place] Work [phone]

## 2017-10-14 LAB — URINE CULTURE: Culture: >=100000 — AB

## 2017-10-14 LAB — COMPREHENSIVE METABOLIC PANEL
ALK PHOS: 67 U/L (ref 38–126)
ALT: 22 U/L (ref 0–44)
AST: 26 U/L (ref 15–41)
Albumin: 2.7 g/dL — ABNORMAL LOW (ref 3.5–5.0)
Anion gap: 7 (ref 5–15)
BUN: 10 mg/dL (ref 8–23)
CALCIUM: 8.4 mg/dL — AB (ref 8.9–10.3)
CO2: 32 mmol/L (ref 22–32)
CREATININE: 0.89 mg/dL (ref 0.44–1.00)
Chloride: 101 mmol/L (ref 98–111)
GFR calc non Af Amer: >60 mL/min (ref >60)
GLUCOSE: 90 mg/dL (ref 70–99)
Potassium: 3.6 mmol/L (ref 3.5–5.1)
SODIUM: 140 mmol/L (ref 135–145)
Total Bilirubin: 0.8 mg/dL (ref 0.3–1.2)
Total Protein: 5 g/dL — ABNORMAL LOW (ref 6.5–8.1)

## 2017-10-14 MED ORDER — HYDROCODONE-ACETAMINOPHEN 10-325 MG PO TABS: 1.0000 | ORAL_TABLET | ORAL | 0 refills | Status: DC | PRN

## 2017-10-14 MED ORDER — CIPROFLOXACIN HCL 500 MG PO TABS
500.0000 mg | ORAL_TABLET | Freq: Two times a day (BID) | ORAL | Status: DC
  Administered 2017-10-14: 500 mg via ORAL
  Filled 2017-10-14: qty 1

## 2017-10-14 MED ORDER — ALPRAZOLAM 0.25 MG PO TABS: 0.2500 mg | ORAL_TABLET | Freq: Every day | ORAL | 0 refills | Status: DC | PRN

## 2017-10-14 MED ORDER — LACTULOSE 10 GM/15ML PO SOLN: 10.0000 g | Freq: Every day | ORAL | 0 refills | Status: DC

## 2017-10-14 MED ORDER — ZOLPIDEM TARTRATE 5 MG PO TABS: 5.0000 mg | ORAL_TABLET | Freq: Every evening | ORAL | 0 refills | Status: AC | PRN

## 2017-10-14 MED ORDER — POLYETHYLENE GLYCOL 3350 17 G PO PACK: 17.0000 g | PACK | Freq: Every day | ORAL | 0 refills | Status: DC

## 2017-10-14 MED ORDER — CIPROFLOXACIN HCL 500 MG PO TABS: 500.0000 mg | ORAL_TABLET | Freq: Two times a day (BID) | ORAL | 0 refills | Status: DC

## 2017-10-14 NOTE — Anesthesia Postprocedure Evaluation (Signed)
Anesthesia Post Note  Patient: Michele Mcgee  Procedure(s) Performed: REVISION OF LUMBAR HARDWARE LUMBAR THREE-FOUR (N/A Spine Lumbar)     Patient location during evaluation: PACU Anesthesia Type: General Level of consciousness: awake and alert Pain management: pain level controlled Vital Signs Assessment: post-procedure vital signs reviewed and stable Respiratory status: spontaneous breathing, nonlabored ventilation, respiratory function stable and patient connected to nasal cannula oxygen Cardiovascular status: blood pressure returned to baseline and stable Postop Assessment: no apparent nausea or vomiting Anesthetic complications: no    Last Vitals:  Vitals:   10/14/17 1143 10/14/17 1144  BP: 120/62   Pulse: 90 79  Resp:    Temp: 37.1 C   SpO2: 91% 94%    Last Pain:  Vitals:   10/14/17 0805  TempSrc:   PainSc: 9                  Tiajuana Amass

## 2017-10-14 NOTE — Clinical Social Work Note (Signed)
[facility] has insurance authorization when patient is stable for discharge.  Dayton Scrape, [place]

## 2017-10-14 NOTE — Progress Notes (Signed)
Neurosurgery Progress Note  No issues overnight. She fears her pre op pain will return, although has not been having pains similar. She is working on her confidence with mobility and getting over her fear. Believes she is improving each day.  EXAM:  BP (!) 128/56 (BP Location: Right Arm)   Pulse 75   Temp 98.7 F (37.1 C) (Oral)   Resp 19   Ht 4\' 9"  (1.448 m)   Wt 109.9 kg (242 lb 4.6 oz)   SpO2 93%   BMI 52.43 kg/m   Awake, alert, oriented  Speech fluent, appropriate  MAEW with good strength Incision c/d/i Hemovac drain: minimal output  PLAN Stable this am Being treated for Klebsiella UTI by TH.  Hemovac with minimal drainage. Will discontinue Otherwise, continue to work with therapy Stable for d/c once bed available at SNF from NS perspective

## 2017-10-14 NOTE — Progress Notes (Signed)
Pt requested to have foley cath placed for the night so she would not have to get up to void. This nurse explained that catheters can cause UTI and she was already being treated for this. Pt verbalized understanding. Katherina Right, RN

## 2017-10-14 NOTE — Clinical Social Work Note (Signed)
CSW facilitated patient discharge including contacting patient family and facility to confirm patient discharge plans. Clinical information faxed to facility and family agreeable with plan. CSW arranged ambulance transport via [facility] to [facility] at 1:45 pm. RN to call report prior to discharge [phone]).  CSW will sign off for now as social work intervention is no longer needed. Please consult [place] again if new needs arise.  Dayton Scrape, [place]

## 2017-10-14 NOTE — Discharge Summary (Signed)
Physician Discharge Summary  Michele Mcgee QQV:[id number] DOB: [birth year] DOA: 10/05/2017  PCP: Kirk Ruths, MD  Admit date: 10/05/2017 Discharge date: 10/14/2017  Time spent: 20 minutes  Recommendations for Outpatient Follow-up:  1. Patient has been given limited prescriptions of controlled substances oxycodone 10 in addition to Ambien as needed at bedtime for sleep-in addition she will need first choice of medication tramadol and Tylenol for moderate pain 2. She should receive daily therapy as able at her facility-would recommend orthostatic blood pressures and adjustment of blood pressure medications if she becomes dizzy as she complained of this on day of discharge 3. Outpatient follow-up needed with neurosurgeon and orthopedics 4. It was treated for catheter associated UTI Klebsiella-given 2 days of IV antibiotics and then transition to 2 more days p.o. of Cipro on discharge 5. Recheck basic and CBC in 1 week 6. Recommend lifestyle modification and dietary modification-family brings her doughnuts and fast food typically and she will need education and adherence to proper diet in order to lose some weight her BMI is over 50  Discharge Diagnoses:  Principal Problem:   Fall Active Problems:   Coronary artery disease   GERD (gastroesophageal reflux disease)   HLD (hyperlipidemia)   Tear of left hamstring   Left leg pain   Depression   Essential hypertension   Morbid obesity (HCC)   Tear of left gluteus medius tendon   Lumbar stress fracture   Discharge Condition: Improved  Diet recommendation: Heart healthy low-salt  Filed Weights   10/06/17 1523 10/12/17 0947 10/12/17 1706  Weight: 111.6 kg (246 lb) 111.6 kg (246 lb) 109.9 kg (242 lb 4.6 oz)    History of present illness:  72 fem hypertension, CAD and recent lumbar fusion at L3-4 weeks ago secondary to lumbar spinal stenosis and radiculopathy who was at rehab and then had a fall landing on her left side.    severe  pain in her left upper thigh down to her foot most on the lateral side 10/10.  Unable to stand.  Seen on 6/25 with a negative CT of the hip, but pain persisted so she was reevaluated in the emergency room the night of 6/26 at [facility].   CT of the lumbar spine was done showing questionable hardware loosening and fracture of the lateral vertebral body cortex.  Emergency room physician spoke with neurosurgery who advised patient to be transferred over to [facility].  Patient was admitted and is currently stable.  Seen by neurosurgery who is ordered an MRI of the hip and spine.    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    Patient's MRI no evidence of hip fracture, but does note strain and low-grade partial tear of left gluteus maximus myotendinous junction and of the left hamstring.  Case discussed with Orthopedic surgery who recommended pain control and physical therapy.    Neuro surgery followed up and after reviewing MRI, found no evidence of neurocompression and initially recommended physical therapy.  Over the next few days, patient has had significant issues with pain.  Initially this was involving the left leg requiring adjustments in pain medication and Lidoderm patch.  Unfortunately, it has reached the point where patient is unable to even stand for any period of time.  Starting on the evening of 7/1, patient started having pain in her right  leg.    Neurosurgery followed up after discussion with daughter and patient, plan to take patient back to the OR on 7/3 for expiration of lumbar wound and revision of lumbar hardware with possible kyphoplasty and/or possible extension of lumbar fusion/instrumentation.      Hospital Course:  Fall with partial tears of left gluteus maximus myotendinous junction and left hamstring:   orthopedic attending  recommended pain control and PT per my partner Continue oral opiates and IV morphine as needed  Catheter associated UTI-developed burning in the urine and found to have Klebsiella UTI-transitioned from IV to p.o. ciprofloxacin to complete 4 days total on discharge  Recent lumbar fusion with recurrent back pain and lower leg pain bilaterally:  Revision of surgery 7/3-NS following and appreciated Postop care as per neurosurgeon--okay for discharge to skilled facility    Coronary artery disease: Stable-T new prior to admission metoprolol and other meds  Anxiety/depression: Stable, no suicidal ideations.  Continue home medications.  Patient gets severely anxious and distressed.  Added low-dose Ambien at night for sleep    Essential hypertension: Continue antihypertensive medications, blood pressure well controlled on pressure    Morbid obesity ([place]): Patient meets criteria BMI greater than 50--super-morbid obeseity and life-threatening  Constipation: In part from dehydration as well as from opiate medications.  After 1 week of constipation, responded well with lactulose.  Has started daily MiraLAX scheduled Continue Senokot in addition-continue bowel regimen   sciatica:  From prolonged immobilization.  Some relief with lidocaine patch.  Once patient is more ambulatory, this will continue to improve.    Procedures: Redo arthrodesis 10/12/2017 by Dr. Arnoldo Morale with removal of hardware Consultations:  Neurosurgery  Discharge Exam: Vitals:   10/14/17 0733 10/14/17 1022  BP: (!) 128/56 (!) 139/45  Pulse: 75 96  Resp:    Temp: 98.7 F (37.1 C)   SpO2: 93%     General: Awake alert pleasant moved around today with no distress and scheduled to have drain come out No distress at present time F2 Cardiovascular: S1-S2 no murmur rub or gallop Respiratory: Clinically clear without added sound Moves lower extremities without deficit is limited by pain in the left  side secondary to gluteus tear  Discharge Instructions   Discharge Instructions    Diet - low sodium heart healthy   Complete by:  As directed    Increase activity slowly   Complete by:  As directed      Allergies as of 10/14/2017      Reactions   Oxycodone    Ampicillin Rash   Has patient had a PCN reaction causing immediate rash, facial/tongue/throat swelling, SOB or lightheadedness with hypotension: Rash Has patient had a PCN reaction causing severe rash involving mucus membranes or skin necrosis: No Has patient had a PCN reaction that required hospitalization: No Has patient had a PCN reaction occurring within the last 10 years: No If all of the above answers are "NO", then may proceed with Cephalosporin use.      Medication List    STOP taking these medications   aspirin EC 81 MG tablet   HYDROcodone-acetaminophen 5-325 MG tablet Commonly known as:  NORCO/VICODIN Replaced by:  HYDROcodone-acetaminophen 10-325 MG tablet   PROVENTIL HFA 108 (90 Base) MCG/ACT inhaler Generic drug:  albuterol   ST JOSEPH ASPIRIN 81 MG EC tablet Generic drug:  aspirin     TAKE these medications   acetaminophen 325 MG tablet Commonly known as:  TYLENOL Take 650 mg by mouth every 4 (four)  hours as needed for mild pain or moderate pain. What changed:  Another medication with the same name was removed. Continue taking this medication, and follow the directions you see here.   ALPRAZolam 0.25 MG tablet Commonly known as:  XANAX Take 1 tablet (0.25 mg total) by mouth daily as needed for anxiety.   atorvastatin 40 MG tablet Commonly known as:  LIPITOR Take 40 mg by mouth every evening.   CALCIUM 600+D3 600-400 MG-UNIT Tabs Generic drug:  Calcium Carbonate-Vitamin D3 Take 1 tablet by mouth 2 (two) times daily.   CENTRUM SILVER 50+WOMEN Tabs Take 1 tablet by mouth daily.   ciprofloxacin 500 MG tablet Commonly known as:  CIPRO Take 1 tablet (500 mg total) by mouth 2 (two) times  daily.   cyclobenzaprine 10 MG tablet Commonly known as:  FLEXERIL Take 1 tablet (10 mg total) by mouth 3 (three) times daily as needed for muscle spasms.   escitalopram 20 MG tablet Commonly known as:  LEXAPRO Take 20 mg by mouth daily.   esomeprazole 40 MG capsule Commonly known as:  NEXIUM Take 40 mg by mouth daily before breakfast. 30 minutes to 1 hour prior to breakfast   fluticasone 50 MCG/ACT nasal spray Commonly known as:  FLONASE Place 1 spray into both nostrils daily as needed for allergies.   gabapentin 100 MG capsule Commonly known as:  NEURONTIN Take 100 mg by mouth 2 (two) times daily.   HYDROcodone-acetaminophen 10-325 MG tablet Commonly known as:  NORCO Take 1 tablet by mouth every 4 (four) hours as needed for severe pain. Replaces:  HYDROcodone-acetaminophen 5-325 MG tablet   lactulose 10 GM/15ML solution Commonly known as:  CHRONULAC Take 15 mLs (10 g total) by mouth daily. Start taking on:  10/15/2017   lisinopril-hydrochlorothiazide 10-12.5 MG tablet Commonly known as:  PRINZIDE,ZESTORETIC Take 1 tablet by mouth daily.   metoprolol succinate 25 MG 24 hr tablet Commonly known as:  TOPROL-XL Take 25 mg by mouth daily.   PERI-COLACE 8.6-50 MG tablet Generic drug:  senna-docusate Take 2 tablets by mouth 2 (two) times daily.   polyethylene glycol packet Commonly known as:  MIRALAX / GLYCOLAX Take 17 g by mouth daily. Start taking on:  10/15/2017   torsemide 20 MG tablet Commonly known as:  DEMADEX Take 20 mg by mouth daily as needed (for swelling in feet).   traMADol 50 MG tablet Commonly known as:  ULTRAM Take by mouth every 6 (six) hours as needed for moderate pain.   zolpidem 5 MG tablet Commonly known as:  AMBIEN Take 1 tablet (5 mg total) by mouth at bedtime as needed for sleep.      Allergies  Allergen Reactions  . Oxycodone   . Ampicillin Rash    Has patient had a PCN reaction causing immediate rash, facial/tongue/throat swelling,  SOB or lightheadedness with hypotension: Rash Has patient had a PCN reaction causing severe rash involving mucus membranes or skin necrosis: No Has patient had a PCN reaction that required hospitalization: No Has patient had a PCN reaction occurring within the last 10 years: No If all of the above answers are "NO", then may proceed with Cephalosporin use.    Contact information for after-discharge care    [facility] .   Service:  Skilled Nursing Contact information: [place] [phone]               The results of significant diagnostics from  this hospitalization (including imaging, microbiology, ancillary and laboratory) are listed below for reference.    Significant Diagnostic Studies: Dg Chest 1 View  Result Date: 09/16/2017 CLINICAL DATA:  Hypoxia.  Nonsmoker. EXAM: CHEST  1 VIEW COMPARISON:  Chest radiograph report dated September 16, 2016 though images are not available for direct comparison. FINDINGS: Cardiac silhouette is moderately enlarged. Mediastinal silhouette is not suspicious. Mild interstitial prominence, low inspiratory examination crowded vascular markings. Blunting LEFT costophrenic angle. No focal consolidation. No pneumothorax. Larger body habitus. Osseous structures are non suspicious. Surgical clips in the included right abdomen compatible with cholecystectomy. IMPRESSION: Cardiomegaly. Mild interstitial prominence seen with atypical infection or pulmonary edema. Trace LEFT pleural effusion versus pleural thickening. Electronically Signed   By: Elon Alas M.D.   On: 09/16/2017 15:20   Dg Lumbar Spine 2-3 Views  Result Date: 10/12/2017 CLINICAL DATA:  Revision of L3-4 posterior fusion. EXAM: DG C-ARM 61-120 MIN; LUMBAR SPINE - 2-3 VIEW FLUOROSCOPY TIME:  1 minutes 0 seconds. COMPARISON:  MRI of October 07, 2017. FINDINGS: Two intraoperative fluoroscopic images were obtained of  the lower lumbar spine. Previously noted posterior fusion of L3-4 is noted with bilateral intrapedicular screw placement and interbody fusion. Interval placement of bilateral intrapedicular screws are noted at L5. IMPRESSION: Status post surgical posterior fusion of L3-4 and L4-5. Electronically Signed   By: Marijo Conception, M.D.   On: 10/12/2017 14:26   Dg Lumbar Spine 2-3 Views  Result Date: 09/16/2017 CLINICAL DATA:  Low back pain. Status post spinal fusion 2 days ago. EXAM: LUMBAR SPINE - 2-3 VIEW COMPARISON:  09/14/2017, 08/19/2017 and lumbar spine MR dated 05/20/2017. FINDINGS: Interval laminectomy defects and interbody and pedicle screw and rod fusion at the L3-4 level based on the labeling of the levels on the previous MRI. The pedicle screws extend into the superior aspects of the L3 and L4 vertebral bodies, including the area of the superior endplate compression fracture of the L3 vertebral body. Mild anterolisthesis at the L3-4 level, with some improvement. Upper lumbar and lower thoracic spine degenerative changes. Atheromatous arterial calcifications. IMPRESSION: Degenerative and postsurgical changes, as described above. Electronically Signed   By: Claudie Revering M.D.   On: 09/16/2017 15:25   Dg Lumbar Spine 2-3 Views  Result Date: 09/14/2017 CLINICAL DATA:  L3-L4 fusion EXAM: DG C-ARM 61-120 MIN; LUMBAR SPINE - 2-3 VIEW COMPARISON:  Intraoperative images 6 08/2017, preoperative images 08/19/2017 FLUOROSCOPY TIME:  0 minutes 46 seconds FINDINGS: BILATERAL pedicle screws have been placed at adjacent levels in the lower lumbar spine with an intervening disc prosthesis. Bony landmarks are difficult to visualize, unable to confidently assign segment levels. Marked osseous demineralization limits exam. IMPRESSION: Placement of BILATERAL pedicle screws and intervening disc prosthesis at the lower lumbar spine. Electronically Signed   By: Lavonia Dana M.D.   On: 09/14/2017 17:59   Ct Chest Wo  Contrast  Result Date: 09/16/2017 CLINICAL DATA:  Possible pleural effusions. Hypoxia. Interstitial lung disease. EXAM: CT CHEST WITHOUT CONTRAST TECHNIQUE: Multidetector CT imaging of the chest was performed following the standard protocol without IV contrast. COMPARISON:  09/11/2008 FINDINGS: Cardiovascular: Heart is normal size. There is moderate calcified plaque over the left main and 3 vessel coronary arteries. Mild calcified plaque over the thoracic aorta. Remaining vascular structures are unremarkable. Mediastinum/Nodes: No mediastinal or hilar adenopathy. Remaining mediastinal structures are unremarkable. Lungs/Pleura: Lungs are well inflated without focal airspace consolidation or effusion. There is very minimal bibasilar dependent atelectasis. No evidence of effusion. Airways are  normal. Upper Abdomen: Previous cholecystectomy. Musculoskeletal: Degenerative change of the spine. IMPRESSION: No acute findings. No evidence of pleural effusion. Very minimal dependent bibasilar atelectasis. Atherosclerotic coronary artery disease. Aortic Atherosclerosis (ICD10-I70.0). Electronically Signed   By: Marin Olp M.D.   On: 09/16/2017 20:57   Ct Lumbar Spine Wo Contrast  Result Date: 10/05/2017 CLINICAL DATA:  Recent surgery, followed by a more recent fall. Hip pain and back pain. EXAM: CT LUMBAR SPINE WITHOUT CONTRAST TECHNIQUE: Multidetector CT imaging of the lumbar spine was performed without intravenous contrast administration. Multiplanar CT image reconstructions were also generated. COMPARISON:  Lumbar spine radiograph 09/16/2017, 08/19/2017 Lumbar spine MRI 05/20/2017 FINDINGS: Segmentation: There is transitional lumbosacral anatomy. Numbering is preserved from the prior studies, with the fused levels considered to be L3-L4. Alignment: Normal alignment. Vertebrae: Old superior endplate fracture of L3 is unchanged. There is PLIF hardware at L3-L4. There is lucency surrounding both L4 screws with  irregularity of the lateral vertebral body cortex (series 4, image 101) on the left. Paraspinal and other soft tissues: There is calcific aortic atherosclerosis. Disc levels: There is no stenosis or large disc herniation of the disc levels above L2. At L2-L3, there is no spinal canal stenosis or neural foraminal stenosis. There is mild disc space narrowing and facet hypertrophy. No residual spinal canal stenosis at L4-L5. Neural foramina are patent. At L5-S1, there is mild bilateral foraminal narrowing. IMPRESSION: 1. L3-L4 PLIF with marked lucency surrounding both L4 transpedicular screws, with irregularity of the left lateral vertebral body cortex. With recent surgery and no available prior lumbar spine CT, the acuity of the findings is uncertain, but given the patient's recent fall, this is concerning for hardware loosening and fracture of the lateral vertebral body cortex. 2. No spinal canal stenosis. 3. Mild bilateral L5-S1 neural foraminal narrowing. 4.  Aortic Atherosclerosis (ICD10-I70.0). Electronically Signed   By: Ulyses Jarred M.D.   On: 10/05/2017 19:22   Mr Lumbar Spine W Wo Contrast  Result Date: 10/07/2017 CLINICAL DATA:  Left leg pain predominantly located laterally after a recent fall. Numbness at times. Lumbar fusion on 09/14/2017. Concern for L4 screw loosening and vertebral body fracture on CT. EXAM: MRI LUMBAR SPINE WITHOUT AND WITH CONTRAST TECHNIQUE: Multiplanar and multiecho pulse sequences of the lumbar spine were obtained without and with intravenous contrast. CONTRAST:  59mL MULTIHANCE GADOBENATE DIMEGLUMINE 529 MG/ML IV SOLN COMPARISON:  Lumbar spine CT 10/05/2017 and MRI 05/20/2017 FINDINGS: The study is intermittently up to moderately motion degraded. Segmentation: Transitional lumbosacral anatomy with partial sacralization of L5, the same numbering as previously used. Alignment: Exaggerated lumbar lordosis. Trace retrolisthesis of L2 on L3. Mild lumbar levoscoliosis. Vertebrae:  L3-4 posterior and interbody fusion with susceptibility artifact limiting assessment of this region including for acute fracture. A chronic L3 superior endplate compression fracture is again noted. No suspicious osseous lesion is identified. Conus medullaris and cauda equina: Conus extends to the L1 level. There is a small T2 hyperintense subdural fluid collection ventrally at L1-2. There may be a small amount of dorsal subdural or epidural fluid at L4-5. Diffuse smooth dural enhancement is noted throughout the lumbar spine extending into the sacral canal as well as the included lower thoracic spine. Paraspinal and other soft tissues: Postoperative changes in the posterior lumbar soft tissues. 3.8 x 3.4 x 8.8 cm subcutaneous fluid collection from T12-L4. 4.0 x 2.6 x 2.6 cm dorsal epidural fluid collection in the laminectomy bed at L3. Disc levels: T11-12: Mild disc space narrowing. Mild disc bulging, small  central disc protrusion, and mild facet hypertrophy without significant stenosis, unchanged from the prior MRI. T12-L1: Mild facet hypertrophy without disc herniation or stenosis, unchanged. L1-2: Effacement of the subarachnoid CSF by the subdural collection. Mild facet hypertrophy. No disc herniation or degenerative stenosis. L2-3: Interval laminectomies. Dorsal epidural fluid collection moderately narrows the thecal sac, greatest at the mid L3 vertebral body level. The neural foramina are patent. L3-4: Interval laminectomies. Evaluation limited by motion artifact through this level on axial images. Diffuse epidural enhancement which may represent postoperative granulation tissue. Grossly patent neural foramina. L4-5: Limited assessment due to motion and susceptibility artifact. Partial effacement of the thecal sac by dural/epidural soft tissue thickening and possible small fluid collection. Poorly evaluated neural foramina. L5-S1: Interbody ankylosis. Prior right laminectomy. No significant stenosis. IMPRESSION:  1. Motion degraded examination with recent postoperative changes as above. Dorsal epidural collection at L3 moderately narrows the spinal canal. 2. Small subdural fluid collection and diffuse dural enhancement may be postoperative. Correlate for any clinical or laboratory evidence of infection. Electronically Signed   By: Logan Bores M.D.   On: 10/07/2017 08:47   Mr Hip Left W Wo Contrast  Result Date: 10/07/2017 CLINICAL DATA:  Left leg pain after fall on Sunday. Recent lumbar spine surgery. EXAM: MRI OF THE LEFT HIP WITHOUT AND WITH CONTRAST TECHNIQUE: Multiplanar, multisequence MR imaging was performed both before and after administration of intravenous contrast. CONTRAST:  46mL MULTIHANCE GADOBENATE DIMEGLUMINE 529 MG/ML IV SOLN COMPARISON:  CT left hip dated October 04, 2017. FINDINGS: Bones: There is no evidence of acute fracture, dislocation or avascular necrosis. Mild degenerative changes of the pubic symphysis and left sacroiliac joint. Articular cartilage and labrum Articular cartilage: No focal chondral defect or subchondral signal abnormality identified. Labrum: There is no gross labral tear or paralabral abnormality. Joint or bursal effusion Joint effusion: No significant hip joint effusion. Bursae: No periarticular fluid collection. Muscles and tendons Muscles and tendons: Low-grade partial tear of the left hamstring tendon origin. Fluid along the left gluteus maximus myotendinous junction, consistent with strain and low-grade partial tear. Remaining bilateral gluteal tendons are unremarkable. The right hamstring and bilateral iliopsoas tendons are intact. Mild bilateral piriformis muscle atrophy. Diffuse fatty atrophy of the right tensor fascia lata. Other findings Miscellaneous: Mild superficial soft tissue swelling over the lateral hip. No soft tissue mass. Prior hysterectomy. The visualized internal pelvic contents otherwise appear unremarkable. IMPRESSION: 1.  No acute osseous abnormality. 2.  Strain and low-grade partial tear of the left gluteus maximus myotendinous junction. 3. Low-grade partial tear of the left hamstring tendon origin. Electronically Signed   By: Titus Dubin M.D.   On: 10/07/2017 10:01   Dg Lumbar Spine 1 View  Result Date: 09/14/2017 CLINICAL DATA:  L3-L4 fusion. EXAM: LUMBAR SPINE - 1 VIEW COMPARISON:  Lumbar spine x-rays dated Aug 19, 2017. FINDINGS: Surgical instrumentation projects posterior to the L2-L3 level. Unchanged L3 compression fracture. Unchanged anterolisthesis at L3-L4. IMPRESSION: Intraoperative localization as above. Unchanged L3 compression fracture. Electronically Signed   By: Titus Dubin M.D.   On: 09/14/2017 17:30   Ct Hip Left Wo Contrast  Result Date: 10/04/2017 CLINICAL DATA:  Left hip and leg pain since a fall 2 days ago. EXAM: CT OF THE LEFT HIP WITHOUT CONTRAST TECHNIQUE: Multidetector CT imaging of the left hip was performed according to the standard protocol. Multiplanar CT image reconstructions were also generated. COMPARISON:  None. FINDINGS: Bones/Joint/Cartilage There is no fracture or dislocation. There are degenerative changes at the symphysis  pubis. Slight marginal osteophyte formation on the left acetabulum. Muscles and Tendons Normal. Soft tissues Normal. IMPRESSION: No acute abnormality of the left hip. Electronically Signed   By: Lorriane Shire M.D.   On: 10/04/2017 09:40   Dg C-arm 1-60 Min  Result Date: 10/12/2017 CLINICAL DATA:  Revision of L3-4 posterior fusion. EXAM: DG C-ARM 61-120 MIN; LUMBAR SPINE - 2-3 VIEW FLUOROSCOPY TIME:  1 minutes 0 seconds. COMPARISON:  MRI of October 07, 2017. FINDINGS: Two intraoperative fluoroscopic images were obtained of the lower lumbar spine. Previously noted posterior fusion of L3-4 is noted with bilateral intrapedicular screw placement and interbody fusion. Interval placement of bilateral intrapedicular screws are noted at L5. IMPRESSION: Status post surgical posterior fusion of L3-4 and  L4-5. Electronically Signed   By: Marijo Conception, M.D.   On: 10/12/2017 14:26   Dg C-arm 1-60 Min  Result Date: 09/14/2017 CLINICAL DATA:  L3-L4 fusion EXAM: DG C-ARM 61-120 MIN; LUMBAR SPINE - 2-3 VIEW COMPARISON:  Intraoperative images 6 08/2017, preoperative images 08/19/2017 FLUOROSCOPY TIME:  0 minutes 46 seconds FINDINGS: BILATERAL pedicle screws have been placed at adjacent levels in the lower lumbar spine with an intervening disc prosthesis. Bony landmarks are difficult to visualize, unable to confidently assign segment levels. Marked osseous demineralization limits exam. IMPRESSION: Placement of BILATERAL pedicle screws and intervening disc prosthesis at the lower lumbar spine. Electronically Signed   By: Lavonia Dana M.D.   On: 09/14/2017 17:59   Dg Femur Min 2 Views Left  Result Date: 10/05/2017 CLINICAL DATA:  Patient fell Sunday and now has left hip pain. EXAM: LEFT FEMUR 2 VIEWS COMPARISON:  None. FINDINGS: Suspicious subcapital left femoral neck lucency raises concern for a nondisplaced fracture. CT of the left hip is recommended for better assessment. Osteoarthritic spurring of the left acetabular roof is noted. The adjacent left pubic rami appear intact. No acute abnormality of the adjacent left SI joint. The left femoral shaft and femoral condyles are unremarkable. There is mild joint space narrowing of the included knee. No joint effusion is visualized. IMPRESSION: Suspicious lucency at the base of the left femoral head raising concern for possible nondisplaced subcapital left femoral neck fracture. CT is therefore recommended for better correlation. Electronically Signed   By: Ashley Royalty M.D.   On: 10/05/2017 19:37    Microbiology: Recent Results (from the past 240 hour(s))  Surgical pcr screen     Status: None   Collection Time: 10/11/17  9:16 PM  Result Value Ref Range Status   MRSA, PCR NEGATIVE NEGATIVE Final   Staphylococcus aureus NEGATIVE NEGATIVE Final    Comment:  (NOTE) The Xpert SA Assay (FDA approved for NASAL specimens in patients 68 years of age and older), is one component of a comprehensive surveillance program. It is not intended to diagnose infection nor to guide or monitor treatment. Performed at [facility], [place] [address] [place], [place] [address]   Urine Culture     Status: Abnormal   Collection Time: 10/12/17 11:47 AM  Result Value Ref Range Status   Specimen Description URINE, CATHETERIZED  Final   Special Requests   Final    NONE Performed at [facility], [place] [address], [place],  [address]    Culture >=100,000 COLONIES/mL KLEBSIELLA PNEUMONIAE (A)  Final   Report Status 10/14/2017 FINAL  Final   Organism ID, Bacteria KLEBSIELLA PNEUMONIAE (A)  Final      Susceptibility   Klebsiella pneumoniae - MIC*  AMPICILLIN >=32 RESISTANT Resistant     CEFAZOLIN <=4 SENSITIVE Sensitive     CEFTRIAXONE <=1 SENSITIVE Sensitive     CIPROFLOXACIN <=0.25 SENSITIVE Sensitive     GENTAMICIN <=1 SENSITIVE Sensitive     IMIPENEM <=0.25 SENSITIVE Sensitive     NITROFURANTOIN 64 INTERMEDIATE Intermediate     TRIMETH/SULFA <=20 SENSITIVE Sensitive     AMPICILLIN/SULBACTAM 4 SENSITIVE Sensitive     PIP/TAZO <=4 SENSITIVE Sensitive     Extended ESBL NEGATIVE Sensitive     * >=100,000 COLONIES/mL KLEBSIELLA PNEUMONIAE     Labs: Basic Metabolic Panel: Recent Labs  Lab 10/10/17 0641 10/13/17 0422 10/14/17 0257  NA 135 138 140  K 3.6 3.9 3.6  CL 100 105 101  CO2 28 29 32  GLUCOSE 112* 97 90  BUN 15 10 10   CREATININE 0.72 0.70 0.89  CALCIUM 8.9 8.3* 8.4*   Liver Function Tests: Recent Labs  Lab 10/13/17 0422 10/14/17 0257  AST 29 26  ALT 24 22  ALKPHOS 75 67  BILITOT 0.8 0.8  PROT 5.0* 5.0*  ALBUMIN 2.7* 2.7*   No results for input(s): LIPASE, AMYLASE in the last 168 hours. No results for input(s): AMMONIA in the last 168 hours. CBC: Recent Labs  Lab 10/10/17 0641 10/13/17 0422  WBC 6.5 8.8   NEUTROABS  --  6.6  HGB 12.6 10.3*  HCT 38.4 32.9*  MCV 92.1 94.5  PLT 220 172   Cardiac Enzymes: No results for input(s): CKTOTAL, CKMB, CKMBINDEX, TROPONINI in the last 168 hours. BNP: BNP (last 3 results) Recent Labs    10/06/17 0030  BNP 31.6    ProBNP (last 3 results) No results for input(s): PROBNP in the last 8760 hours.  CBG: No results for input(s): GLUCAP in the last 168 hours.     Signed:  Nita Sells MD   [facility] 10/14/2017, 11:11 AM

## 2017-10-14 NOTE — Progress Notes (Signed)
Report call to Rehab nurse Levada Dy at [facility]. Awaiting PTAR.

## 2017-10-14 NOTE — Progress Notes (Signed)
Physical Therapy Treatment Patient Details Name: Michele Mcgee MRN: [id number] DOB: Dec 30, 1945 Today's Date: 10/14/2017    History of Present Illness 72 year old female with past medical history of hypertension, CAD and recent lumbar fusion at L3-4 weeks ago secondary to lumbar spinal stenosis and radiculopathy who was at rehab and then had a fall landing on her left side.  MRI showed partial tears of left gluteus maximus myotendinous junction and left hamstring.  Now s/p surgical revision of lumbar fusion and vertebroplasty L 3, L4, L5 10/12/17.    PT Comments    Pt was able to progress mobility into the hallway with RW and chair to follow today.  She continues to have a posterior preference when on her feet and fatigues easily.  PT will continue to follow her acutely to help progress safe mobility until d/c to post acute rehab.    Follow Up Recommendations  SNF;Supervision/Assistance - 24 hour     Equipment Recommendations  None recommended by PT    Recommendations for Other Services   NA     Precautions / Restrictions Precautions Precautions: Fall;Back Required Braces or Orthoses: Spinal Brace Spinal Brace: Thoracolumbosacral orthotic;Applied in sitting position    Mobility  Bed Mobility Overal bed mobility: Needs Assistance Bed Mobility: Rolling;Sidelying to Sit Rolling: Min assist Sidelying to sit: Mod assist       General bed mobility comments: Min assist to help pt flex knees and roll to her side.  Verbal cues for log roll technique, assist then needed to help boost trunk up to sitting from side lying.  pt managing her own legs for side to sit transition.   Transfers Overall transfer level: Needs assistance Equipment used: Rolling walker (2 wheeled) Transfers: Sit to/from Stand Sit to Stand: Mod assist;+2 physical assistance Stand pivot transfers: +2 physical assistance;Mod assist       General transfer comment: Two person mod assist to stand from bed and pivot to  the [facility].  Pt leaning heavily posteiroly with knees flexed.   Ambulation/Gait Ambulation/Gait assistance: Mod assist;+2 safety/equipment Gait Distance (Feet): 35 Feet Assistive device: Rolling walker (2 wheeled) Gait Pattern/deviations: Step-to pattern;Decreased stride length;Shuffle;Leaning posteriorly     General Gait Details: Pt continues with strong posterior lean at times during gait, cues to correct and pt will for a moment and then she goes back to an almost sway back posture.  Legs fatigue quickly, so followed behind closely with chair both for safety and to encourage increased gait distance.          Balance Overall balance assessment: Needs assistance Sitting-balance support: Feet supported;Bilateral upper extremity supported Sitting balance-Leahy Scale: Poor Sitting balance - Comments: min assist EOB due to posterior bias.  Postural control: Posterior lean Standing balance support: Bilateral upper extremity supported Standing balance-Leahy Scale: Poor Standing balance comment: Suppport from RW and therapist.                             Cognition Arousal/Alertness: Awake/alert Behavior During Therapy: WFL for tasks assessed/performed Overall Cognitive Status: Within Functional Limits for tasks assessed                                 General Comments: Not specifically tested, a bit anxious, but no obvious conversational deficits.              Pertinent Vitals/Pain Pain Assessment: Faces Faces Pain Scale:  Hurts whole lot Pain Location: back with mobility Pain Descriptors / Indicators: Grimacing;Guarding Pain Intervention(s): Limited activity within patient's tolerance;Monitored during session;Repositioned;RN gave pain meds during session           PT Goals (current goals can now be found in the care plan section) Acute Rehab PT Goals Patient Stated Goal: go back to rehab and get stronger so she can get home.  Progress towards PT  goals: Progressing toward goals    Frequency    Min 5X/week      PT Plan Current plan remains appropriate       AM-PAC PT "6 Clicks" Daily Activity  Outcome Measure  Difficulty turning over in bed (including adjusting bedclothes, sheets and blankets)?: Unable Difficulty moving from lying on back to sitting on the side of the bed? : Unable Difficulty sitting down on and standing up from a chair with arms (e.g., wheelchair, bedside commode, etc,.)?: Unable Help needed moving to and from a bed to chair (including a wheelchair)?: A Lot Help needed walking in hospital room?: A Lot Help needed climbing 3-5 steps with a railing? : Total 6 Click Score: 8    End of Session Equipment Utilized During Treatment: Back brace Activity Tolerance: Patient limited by pain Patient left: in chair;with call bell/phone within reach Nurse Communication: Mobility status PT Visit Diagnosis: Unsteadiness on feet (R26.81);Pain;Other abnormalities of gait and mobility (R26.89) Pain - Right/Left: Left Pain - part of body: Leg     Time: 8984-2103 PT Time Calculation (min) (ACUTE ONLY): 25 min  Charges:  $Gait Training: 8-22 mins $Therapeutic Activity: 8-22 mins          Joelie Schou B. [place], [place], DPT [phone]            10/14/2017, 1:31 PM

## 2017-10-14 NOTE — Progress Notes (Signed)
Patient educated on checking her surrounding prior to ambulating or transferring. Pt has high anxiety about falling and being injured. Pt educated on removing cords, area rugs, and any obstacles on the floor that she may trip over. Pt educated on ambulating on a clear path. Pt voiced understanding.

## 2017-10-14 NOTE — Care Management Note (Signed)
Case Management Note  Patient Details  Name: Michele Mcgee MRN: [id number] Date of Birth: Sep 14, 1945  Subjective/Objective: Pt admitted on 10/05/17 s/p fall with LT leg pain.  Pt is s/p revision of lumbar fusion on 10/12/17.  PTA, pt was residing at [facility].                        Action/Plan: CSW consulted to facilitate return to SNF upon medical stability.    Expected Discharge Date:  10/14/17               Expected Discharge Plan:  Skilled Nursing Facility(From [facility])  In-House Referral:  Clinical Social Work  Discharge planning Services  CM Consult  Post Acute Care Choice:    Choice offered to:     DME Arranged:    DME Agency:     HH Arranged:    [place] Agency:     Status of Service:  Completed, signed off  If discussed at [employer] [facility] [employer], dates discussed:    Additional Comments:  10/14/17 J. Baylin Gamblin, RN, BSN Pt medically stable for discharge today.  Plan return to SNF as prior to admission, per CSW arrangements.  Reinaldo Raddle, RN, BSN  Trauma/Neuro ICU Case Manager [phone]

## 2017-10-18 ENCOUNTER — Emergency Department: Payer: Medicare Other

## 2017-10-18 ENCOUNTER — Other Ambulatory Visit: Payer: Self-pay

## 2017-10-18 ENCOUNTER — Emergency Department
Admission: EM | Admit: 2017-10-18 | Discharge: 2017-10-19 | Disposition: A | Discharge status: Home / Self Care | Payer: Medicare Other | Attending: Student in an Organized Health Care Education/Training Program | Admitting: Student in an Organized Health Care Education/Training Program

## 2017-10-18 DIAGNOSIS — I10 Essential (primary) hypertension: Secondary | ICD-10-CM | POA: Insufficient documentation

## 2017-10-18 DIAGNOSIS — K5903 Drug induced constipation: Secondary | ICD-10-CM | POA: Diagnosis not present

## 2017-10-18 DIAGNOSIS — Z79899 Other long term (current) drug therapy: Secondary | ICD-10-CM | POA: Diagnosis not present

## 2017-10-18 DIAGNOSIS — F329 Major depressive disorder, single episode, unspecified: Secondary | ICD-10-CM | POA: Diagnosis not present

## 2017-10-18 DIAGNOSIS — K5641 Fecal impaction: Secondary | ICD-10-CM

## 2017-10-18 DIAGNOSIS — I2510 Atherosclerotic heart disease of native coronary artery without angina pectoris: Secondary | ICD-10-CM | POA: Insufficient documentation

## 2017-10-18 DIAGNOSIS — F419 Anxiety disorder, unspecified: Secondary | ICD-10-CM | POA: Insufficient documentation

## 2017-10-18 DIAGNOSIS — R42 Dizziness and giddiness: Secondary | ICD-10-CM | POA: Diagnosis present

## 2017-10-18 MED ORDER — SODIUM CHLORIDE 0.9 % IV BOLUS
1000.0000 mL | Freq: Once | INTRAVENOUS | Status: AC
  Administered 2017-10-19: 1000 mL via INTRAVENOUS

## 2017-10-18 NOTE — ED Provider Notes (Addendum)
[facility] Emergency Department Provider Note    First MD Initiated Contact with Patient 10/18/17 2319     (approximate)  I have reviewed the triage vital signs and the nursing notes.   HISTORY  Chief Complaint Constipation and Dehydration    HPI Michele Mcgee is a 72 y.o. female post recent lumbar surgery [facility] presents the ER with inability to move her bowels as well as decreased oral intake lightheadedness with standing.  States that she has moved her bowels in over a week and feels very constipated.  Feels pressure in her rectum but is unable to empty her bowel.  States the pain is mild to moderate severity.  No fevers. Has been taking stool softeners without relief.   Past Medical History:  Diagnosis Date  . Anxiety   . Arthritis   . Choledocholithiasis   . Coronary artery disease   . Female bladder prolapse   . GERD (gastroesophageal reflux disease)   . Hypercholesterolemia   . Hypertension   . PONV (postoperative nausea and vomiting)   . Spondylolisthesis of lumbar region    Family History  Problem Relation Age of Onset  . Breast cancer Neg Hx    Past Surgical History:  Procedure Laterality Date  . ABDOMINAL HYSTERECTOMY    . BACK SURGERY    . Bladder Tacking Surgery    . COLONOSCOPY    . COLONOSCOPY WITH PROPOFOL N/A 12/09/2014   Procedure: COLONOSCOPY WITH PROPOFOL;  Surgeon: Manya Silvas, MD;  Location: [facility] ENDOSCOPY;  Service: Endoscopy;  Laterality: N/A;  . ERCP    . ESOPHAGEAL DILATION    . ESOPHAGOGASTRODUODENOSCOPY    . LAMINECTOMY    . LUMBAR DISC SURGERY  09/14/2017  . TUBAL LIGATION    . VEIN LIGATION AND STRIPPING     Patient Active Problem List   Diagnosis Date Noted  . Lumbar stress fracture 10/12/2017  . Tear of left gluteus medius tendon 10/07/2017  . Tear of left hamstring 10/06/2017  . Left leg pain 10/06/2017  . Depression 10/06/2017  . Essential hypertension 10/06/2017  .  Morbid obesity ([place]) 10/06/2017  . Fall 10/05/2017  . Coronary artery disease   . GERD (gastroesophageal reflux disease)   . HLD (hyperlipidemia)   . Post-operative pain 09/16/2017  . Spondylolisthesis of lumbar region 09/14/2017      Prior to Admission medications   Medication Sig Start Date End Date Taking? Authorizing Provider  acetaminophen (TYLENOL) 325 MG tablet Take 650 mg by mouth every 4 (four) hours as needed for mild pain or moderate pain.    [provider]  ALPRAZolam Duanne Moron) 0.25 MG tablet Take 1 tablet (0.25 mg total) by mouth daily as needed for anxiety. 10/14/17   Nita Sells, MD  atorvastatin (LIPITOR) 40 MG tablet Take 40 mg by mouth every evening.    [provider]  Calcium Carbonate-Vitamin D3 (CALCIUM 600+D3) 600-400 MG-UNIT TABS Take 1 tablet by mouth 2 (two) times daily.     [provider]  ciprofloxacin (CIPRO) 500 MG tablet Take 1 tablet (500 mg total) by mouth 2 (two) times daily. 10/14/17   Nita Sells, MD  cyclobenzaprine (FLEXERIL) 10 MG tablet Take 1 tablet (10 mg total) by mouth 3 (three) times daily as needed for muscle spasms. 09/15/17   Newman Pies, MD  escitalopram (LEXAPRO) 20 MG tablet Take 20 mg by mouth daily.    [provider]  esomeprazole (NEXIUM) 40 MG capsule Take 40  mg by mouth daily before breakfast. 30 minutes to 1 hour prior to breakfast    [provider]  fluticasone (FLONASE) 50 MCG/ACT nasal spray Place 1 spray into both nostrils daily as needed for allergies.    [provider]  gabapentin (NEURONTIN) 100 MG capsule Take 100 mg by mouth 2 (two) times daily.    [provider]  glycerin adult 2 g suppository Place 1 suppository rectally as needed for constipation. 10/19/17   Merlyn Lot, MD  HYDROcodone-acetaminophen [facility]) 10-325 MG tablet Take 1 tablet by mouth every 4 (four) hours as needed for severe pain. 10/14/17   Nita Sells, MD    lactulose (CHRONULAC) 10 GM/15ML solution Take 15 mLs (10 g total) by mouth daily. 10/15/17   Nita Sells, MD  lisinopril-hydrochlorothiazide (PRINZIDE,ZESTORETIC) 10-12.5 MG per tablet Take 1 tablet by mouth daily.    [provider]  metoprolol succinate (TOPROL-XL) 25 MG 24 hr tablet Take 25 mg by mouth daily.    [provider]  Multiple Vitamins-Minerals (CENTRUM SILVER 50+WOMEN) TABS Take 1 tablet by mouth daily.    [provider]  polyethylene glycol (MIRALAX / GLYCOLAX) packet Take 17 g by mouth daily. 10/15/17   Nita Sells, MD  senna-docusate (PERI-COLACE) 8.6-50 MG tablet Take 2 tablets by mouth 2 (two) times daily.    [provider]  torsemide (DEMADEX) 20 MG tablet Take 20 mg by mouth daily as needed (for swelling in feet).    [provider]  traMADol (ULTRAM) 50 MG tablet Take by mouth every 6 (six) hours as needed for moderate pain.    [provider]  zolpidem (AMBIEN) 5 MG tablet Take 1 tablet (5 mg total) by mouth at bedtime as needed for sleep. 10/14/17   Nita Sells, MD    Allergies Oxycodone and Ampicillin    Social History Social History   Tobacco Use  . Smoking status: Never Smoker  . Smokeless tobacco: Never Used  Substance Use Topics  . Alcohol use: Never    Frequency: Never  . Drug use: Never    Review of Systems Patient denies headaches, rhinorrhea, blurry vision, numbness, shortness of breath, chest pain, edema, cough, abdominal pain, nausea, vomiting, diarrhea, dysuria, fevers, rashes or hallucinations unless otherwise stated above in HPI. ____________________________________________   PHYSICAL EXAM:  VITAL SIGNS: Vitals:   10/18/17 2008 10/19/17 0031  BP: (!) 149/76 (!) 123/54  Pulse: (!) 110 99  Resp: 20   Temp: 98.6 F (37 C)   SpO2: 97% 96%    Constitutional: Alert and oriented.  Eyes: Conjunctivae are normal.  Head: Atraumatic. Nose: No  congestion/rhinnorhea. Mouth/Throat: Mucous membranes are moist.   Neck: No stridor. Painless ROM.  Cardiovascular: Normal rate, regular rhythm. Grossly normal heart sounds.  Good peripheral circulation. Respiratory: Normal respiratory effort.  No retractions. Lungs CTAB. Gastrointestinal: Soft and nontender. No distention. No abdominal bruits. No CVA tenderness. Genitourinary: Hard formed stool in the rectal vault consistent with rectal impaction.  No rectal masses palpated. Musculoskeletal: No lower extremity tenderness nor edema.  No joint effusions.  L spine surgical incision appears clean dry and intact without any evidence of surrounding erythema. Neurologic:  Normal speech and language. No gross focal neurologic deficits are appreciated. No facial droop Skin:  Skin is warm, dry and intact. No rash noted.  Large ecchymosis and hematoma to the left lower abdominal wall secondary to prophylactic anticoagulation injections during hospitalization.  Nontender no overlying erythema.  Small area of redness on the  left posterior forearm without streaking erythema or significant warmth. Psychiatric: Mood and affect are normal. Speech and behavior are normal.  ____________________________________________   LABS (all labs ordered are listed, but only abnormal results are displayed)  Results for orders placed or performed during the hospital encounter of 10/18/17 (from the past 24 hour(s))  CBC with Differential/Platelet     Status: Abnormal   Collection Time: 10/18/17 11:20 PM  Result Value Ref Range   WBC 11.1 (H) 3.6 - 11.0 K/uL   RBC 3.96 3.80 - 5.20 MIL/uL   Hemoglobin 12.3 12.0 - 16.0 g/dL   HCT 36.8 35.0 - 47.0 %   MCV 92.9 80.0 - 100.0 fL   MCH 30.9 26.0 - 34.0 pg   MCHC 33.3 32.0 - 36.0 g/dL   RDW 15.2 (H) 11.5 - 14.5 %   Platelets 259 150 - 440 K/uL   Neutrophils Relative % 80 %   Neutro Abs 9.0 (H) 1.4 - 6.5 K/uL   Lymphocytes Relative 12 %   Lymphs Abs 1.3 1.0 - 3.6 K/uL    Monocytes Relative 7 %   Monocytes Absolute 0.7 0.2 - 0.9 K/uL   Eosinophils Relative 1 %   Eosinophils Absolute 0.1 0 - 0.7 K/uL   Basophils Relative 0 %   Basophils Absolute 0.0 0 - 0.1 K/uL  Basic metabolic panel     Status: Abnormal   Collection Time: 10/18/17 11:20 PM  Result Value Ref Range   Sodium 138 135 - 145 mmol/L   Potassium 3.9 3.5 - 5.1 mmol/L   Chloride 100 98 - 111 mmol/L   CO2 27 22 - 32 mmol/L   Glucose, Bld 155 (H) 70 - 99 mg/dL   BUN 16 8 - 23 mg/dL   Creatinine, Ser 0.98 0.44 - 1.00 mg/dL   Calcium 9.0 8.9 - 10.3 mg/dL   GFR calc non Af Amer 56 (L) >60 mL/min   GFR calc Af Amer >60 >60 mL/min   Anion gap 11 5 - 15   ____________________________________________ ED ECG REPORT I, Merlyn Lot, the attending physician, personally viewed and interpreted this ECG.   Date: 10/19/2017  EKG Time: 20:05  Rate: 110  Rhythm: normal EKG, normal sinus rhythm, unchanged from previous tracings  Axis: normal  Intervals:normal intervals  ST&T Change: no stemi   ____________________________________________  RADIOLOGY  I personally reviewed all radiographic images ordered to evaluate for the above acute complaints and reviewed radiology reports and findings.  These findings were personally discussed with the patient.  Please see medical record for radiology report.  ____________________________________________   PROCEDURES  Procedure(s) performed:  Procedures  ------------------------------------------------------------------------------------------------------------------- Fecal Disimpaction Procedure Note:  Performed by me:  Patient placed in the lateral recumbent position with knees drawn towards chest. Nurse present for patient support. Large amount of hard brown stool removed. No complications during procedure.   ------------------------------------------------------------------------------------------------------------------   Critical Care  performed: no ____________________________________________   INITIAL IMPRESSION / ASSESSMENT AND PLAN / ED COURSE  Pertinent labs & imaging results that were available during my care of the patient were reviewed by me and considered in my medical decision making (see chart for details).   DDX: Constipation, fecal impaction, obstruction, dehydration, lecture light abnormality  Michele Mcgee is a 72 y.o. who presents to the ED with symptoms as described above. Patient is AFVSS in ED. Exam as above. Given current presentation have considered the above differential.  Symptoms improved after fecal disimpaction.  No evidence of obstructive pattern.  Blood work  is reassuring.  Patient remains hemodynamically stable.  Given IV fluids for component of dehydration and decreased p.o. intake.  Patient stable and appropriate for outpatient follow-up.      As part of my medical decision making, I reviewed the following data within the [place] notes reviewed and incorporated, Labs reviewed, notes from prior ED visits.   ____________________________________________   FINAL CLINICAL IMPRESSION(S) / ED DIAGNOSES  Final diagnoses:  Fecal impaction in rectum (HCC)  Drug-induced constipation      NEW MEDICATIONS STARTED DURING THIS VISIT:  New Prescriptions   GLYCERIN ADULT 2 G SUPPOSITORY    Place 1 suppository rectally as needed for constipation.     Note:  This document was prepared using Dragon voice recognition software and may include unintentional dictation errors.    Merlyn Lot, MD 10/19/17 Mary Sella    Merlyn Lot, MD 10/19/17 [phone]

## 2017-10-18 NOTE — ED Triage Notes (Signed)
Pt arrives to ED vias POV from home with c/o dehydration and constipation since last Thursday following back surgery last Wednesday. Pt reports urinating normally, but reports weakness and feeling dehydrated. Pt denies any c/o N/V, no fever or SHOB.

## 2017-10-19 ENCOUNTER — Emergency Department: Payer: Medicare Other

## 2017-10-19 LAB — CBC WITH DIFFERENTIAL/PLATELET
BASOS ABS: 0 10*3/uL (ref 0–0.1)
BASOS PCT: 0 %
EOS PCT: 1 %
Eosinophils Absolute: 0.1 10*3/uL (ref 0–0.7)
HCT: 36.8 % (ref 35.0–47.0)
Hemoglobin: 12.3 g/dL (ref 12.0–16.0)
Lymphocytes Relative: 12 %
Lymphs Abs: 1.3 10*3/uL (ref 1.0–3.6)
MCH: 30.9 pg (ref 26.0–34.0)
MCHC: 33.3 g/dL (ref 32.0–36.0)
MCV: 92.9 fL (ref 80.0–100.0)
MONO ABS: 0.7 10*3/uL (ref 0.2–0.9)
Monocytes Relative: 7 %
NEUTROS ABS: 9 10*3/uL — AB (ref 1.4–6.5)
Neutrophils Relative %: 80 %
PLATELETS: 259 10*3/uL (ref 150–440)
RBC: 3.96 MIL/uL (ref 3.80–5.20)
RDW: 15.2 % — AB (ref 11.5–14.5)
WBC: 11.1 10*3/uL — ABNORMAL HIGH (ref 3.6–11.0)

## 2017-10-19 LAB — BASIC METABOLIC PANEL
Anion gap: 11 (ref 5–15)
BUN: 16 mg/dL (ref 8–23)
CALCIUM: 9 mg/dL (ref 8.9–10.3)
CO2: 27 mmol/L (ref 22–32)
Chloride: 100 mmol/L (ref 98–111)
Creatinine, Ser: 0.98 mg/dL (ref 0.44–1.00)
GFR, EST NON AFRICAN AMERICAN: 56 mL/min — AB (ref >60)
Glucose, Bld: 155 mg/dL — ABNORMAL HIGH (ref 70–99)
Potassium: 3.9 mmol/L (ref 3.5–5.1)
SODIUM: 138 mmol/L (ref 135–145)

## 2017-10-19 MED ORDER — GLYCERIN (ADULT) 2 G RE SUPP: 1.0000 | RECTAL | 0 refills | Status: DC | PRN

## 2017-10-19 MED ORDER — ACETAMINOPHEN 500 MG PO TABS
1000.0000 mg | ORAL_TABLET | Freq: Once | ORAL | Status: AC
  Administered 2017-10-19: 1000 mg via ORAL
  Filled 2017-10-19: qty 2

## 2017-10-19 MED ORDER — LORAZEPAM 0.5 MG PO TABS
0.5000 mg | ORAL_TABLET | Freq: Once | ORAL | Status: AC
  Administered 2017-10-19: 0.5 mg via ORAL
  Filled 2017-10-19: qty 1

## 2017-10-19 NOTE — ED Notes (Addendum)
MD performed manual fecal disimpaction and nurse gave soap subs enema after per MD request and patient was able to void stool.

## 2018-01-09 ENCOUNTER — Ambulatory Visit: Payer: Self-pay | Admitting: Urology

## 2018-04-10 ENCOUNTER — Ambulatory Visit: Payer: Self-pay | Admitting: Urology

## 2018-04-19 ENCOUNTER — Encounter

## 2018-04-19 ENCOUNTER — Ambulatory Visit: Payer: Self-pay | Admitting: Urology

## 2019-01-08 IMAGING — CR DG FEMUR 2+V*L*
1 series · 4 of 4 positions shown · non-contrast
Comparison: None.

CLINICAL DATA: Patient fell [REDACTED] and now has left hip pain.

EXAM:
LEFT FEMUR 2 VIEWS

[Series 1: dg femur min 2 views left · 0.14mm/px · 4 of 4 slices shown]
[im 1/4]
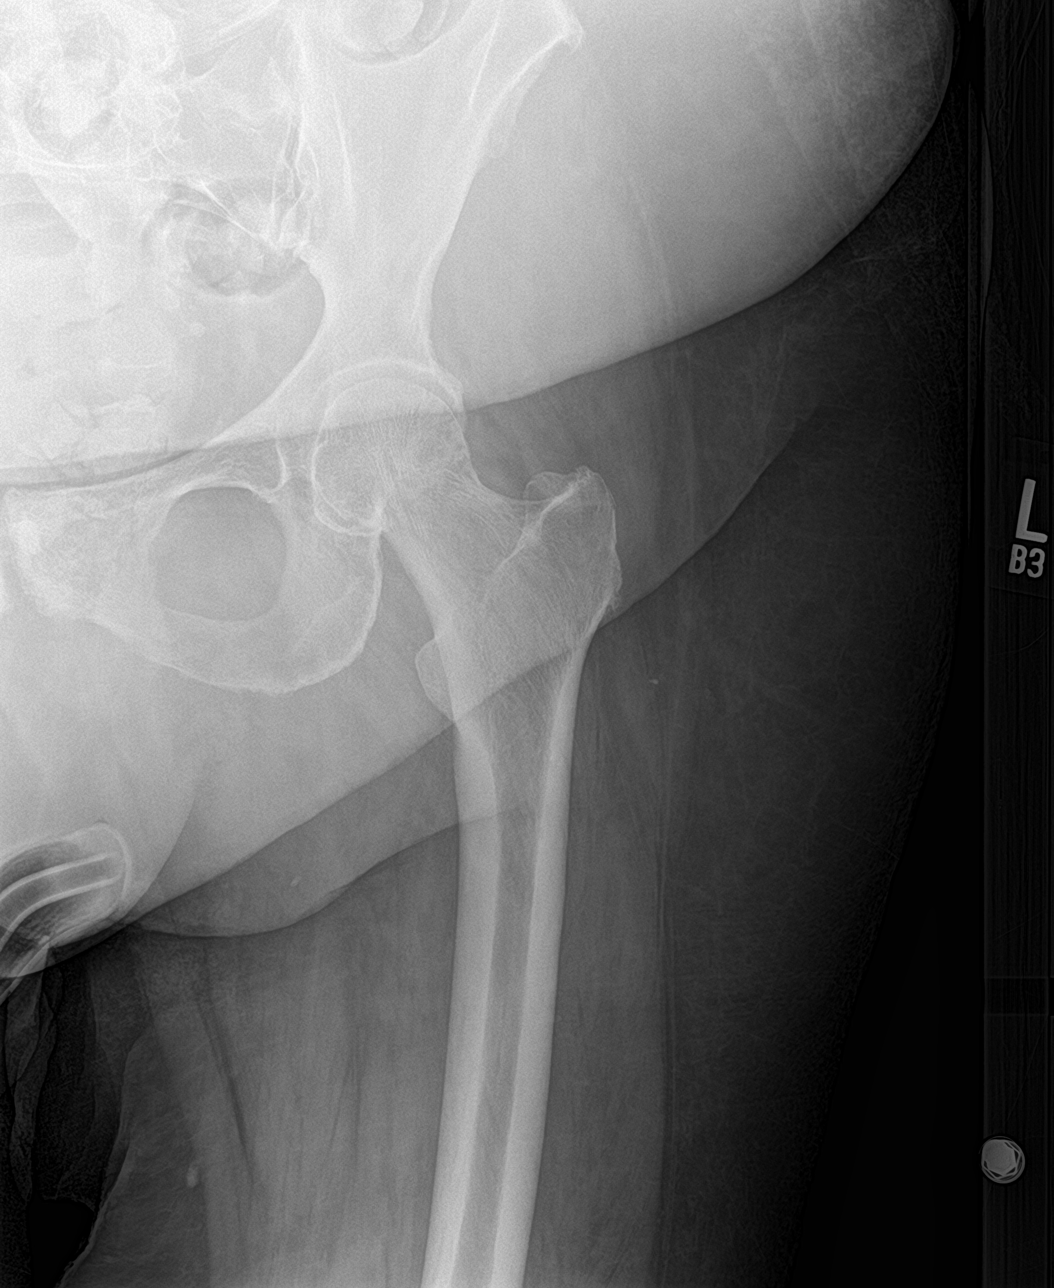
[im 2/4]
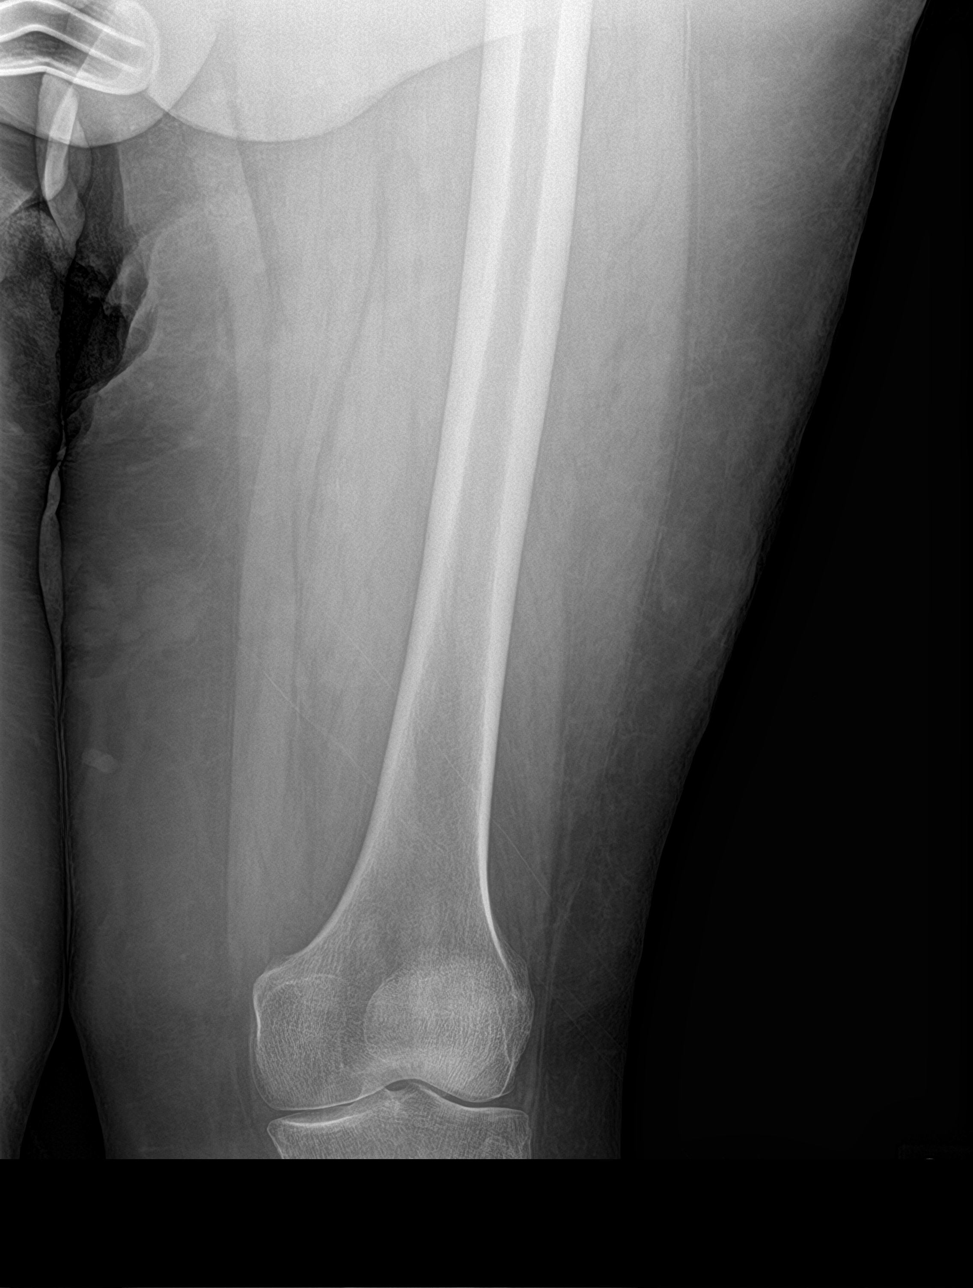
[im 3/4]
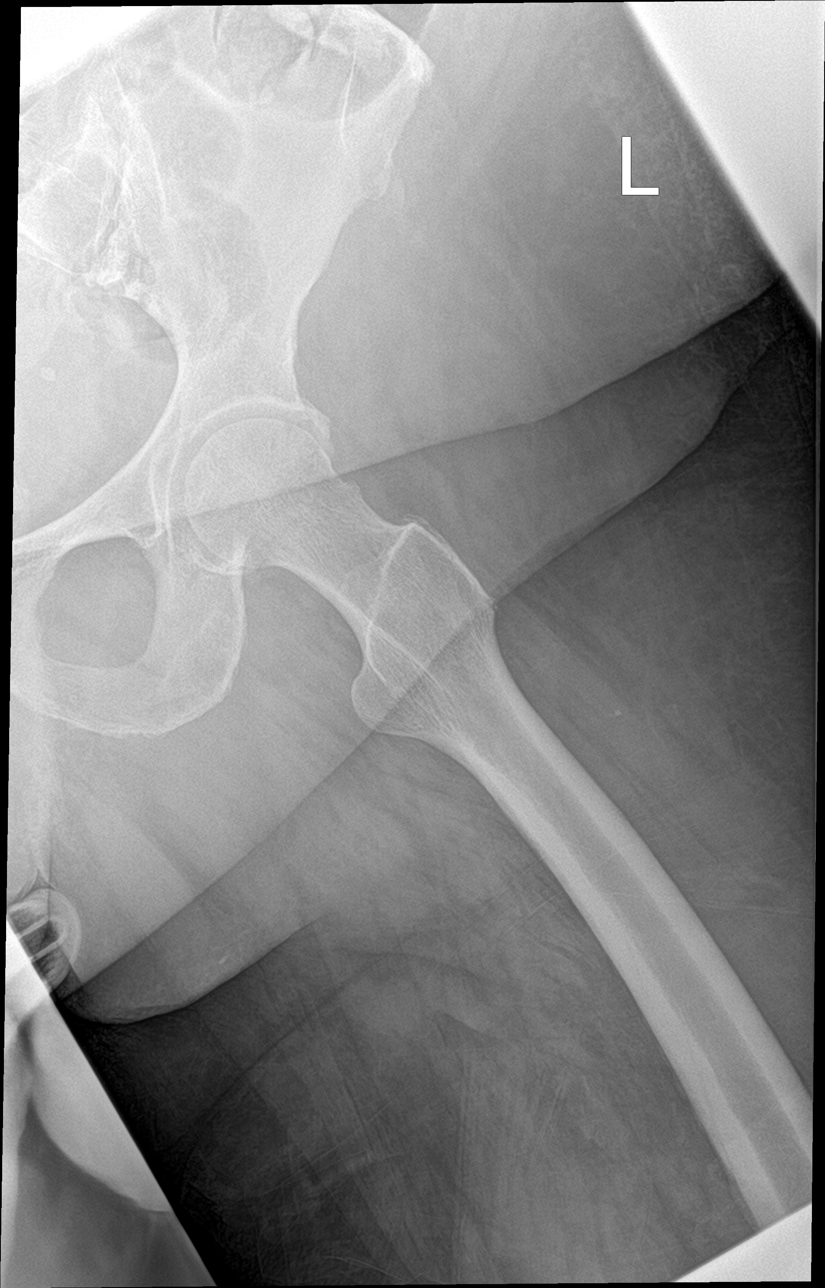
[im 4/4]
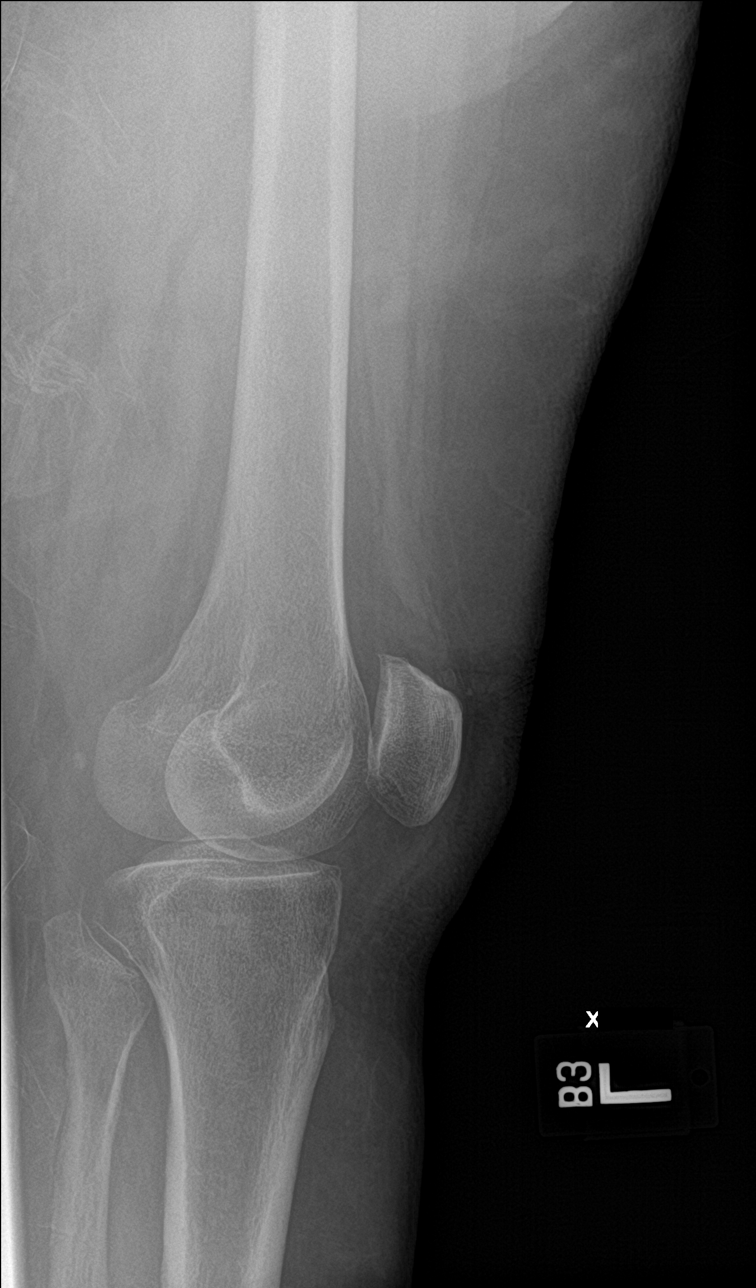

[4 of 4 positions shown; findings below may reference images not displayed]

FINDINGS: Suspicious subcapital left femoral neck lucency raises concern for a
nondisplaced fracture. CT of the left hip is recommended for better
assessment. Osteoarthritic spurring of the left acetabular roof is
noted. The adjacent left pubic rami appear intact. No acute
abnormality of the adjacent left SI joint. The left femoral shaft
and femoral condyles are unremarkable. There is mild joint space
narrowing of the included knee. No joint effusion is visualized.
IMPRESSION: Suspicious lucency at the base of the left femoral head raising
concern for possible nondisplaced subcapital left femoral neck
fracture. CT is therefore recommended for better correlation.

## 2019-01-08 IMAGING — CT CT L SPINE W/O CM
3 series · 13 of 33 positions shown, 16 images · non-contrast
Comparison: Lumbar spine radiograph 09/16/2017, 08/19/2017

Lumbar spine MRI 05/20/2017

CLINICAL DATA: Recent surgery, followed by a more recent fall. Hip
pain and back pain.

EXAM:
CT LUMBAR SPINE WITHOUT CONTRAST
TECHNIQUE: Multidetector CT imaging of the lumbar spine was performed without
intravenous contrast administration. Multiplanar CT image
reconstructions were also generated.

[Series 5: l spine soft (person_name) · axial · 0.41mm/px · z∈[-1108,-934]mm · 5 of 127 slices shown, 7 images]
[im 20/127  soft-tissue]
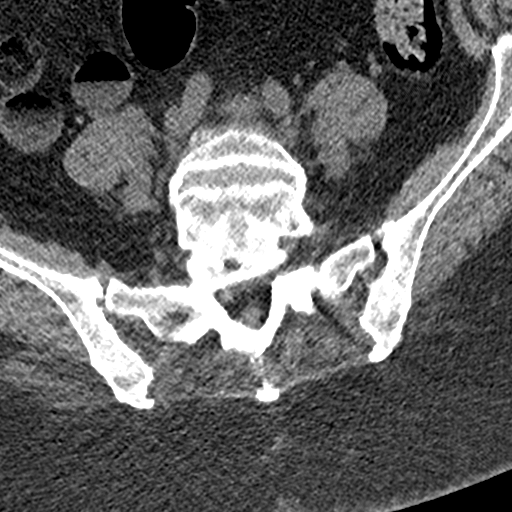
[im 20/127  bone]
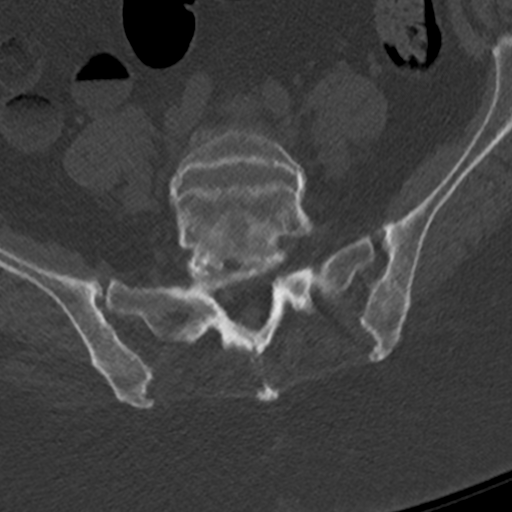
[im 39/127  bone]
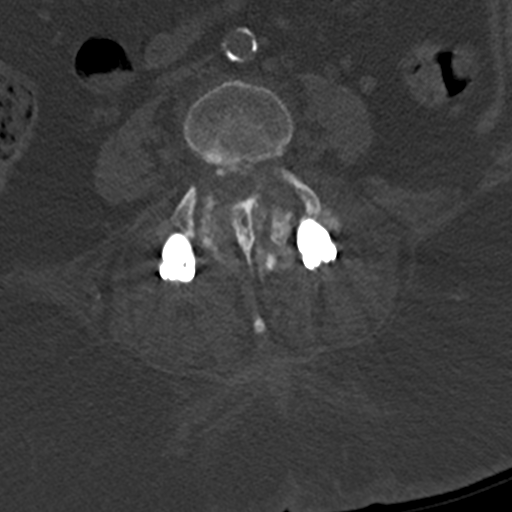
[im 68/127  bone]
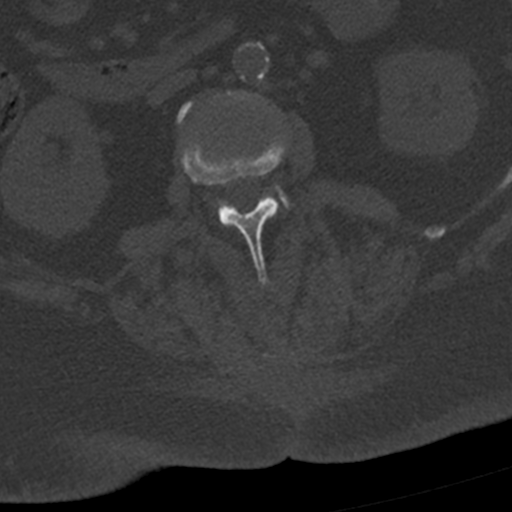
[im 88/127  bone]
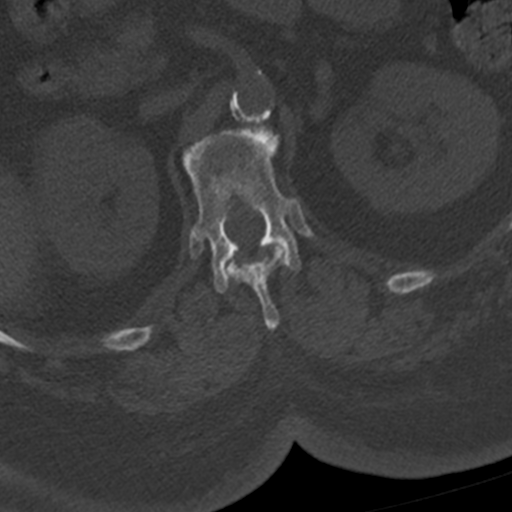
[im 107/127  soft-tissue]
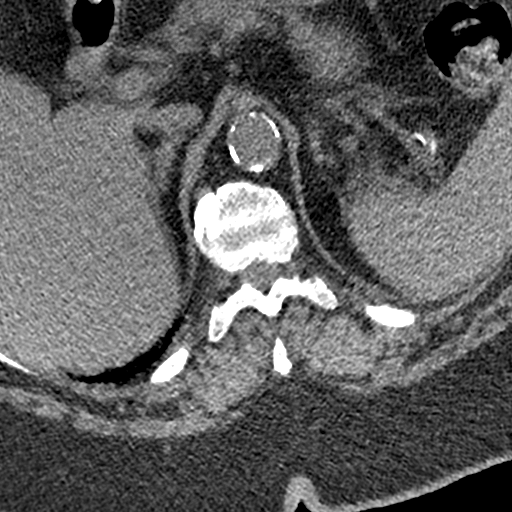
[im 107/127  bone]
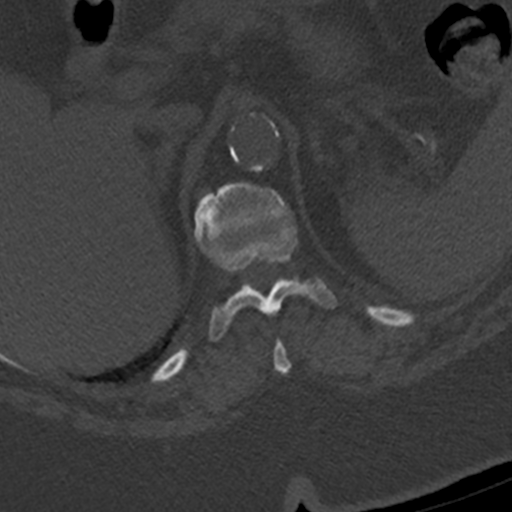

[Series 6: sagittal bone · sagittal · 0.36mm/px · 5 of 81 slices shown, 6 images]
[im 27/81  bone]
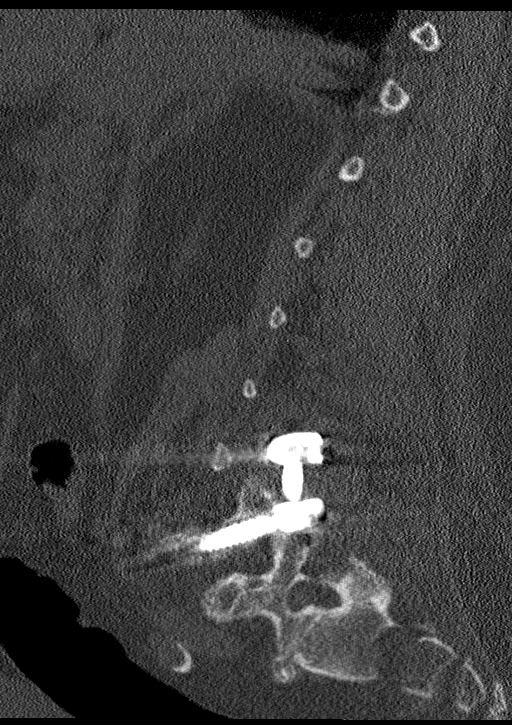
[im 34/81  bone]
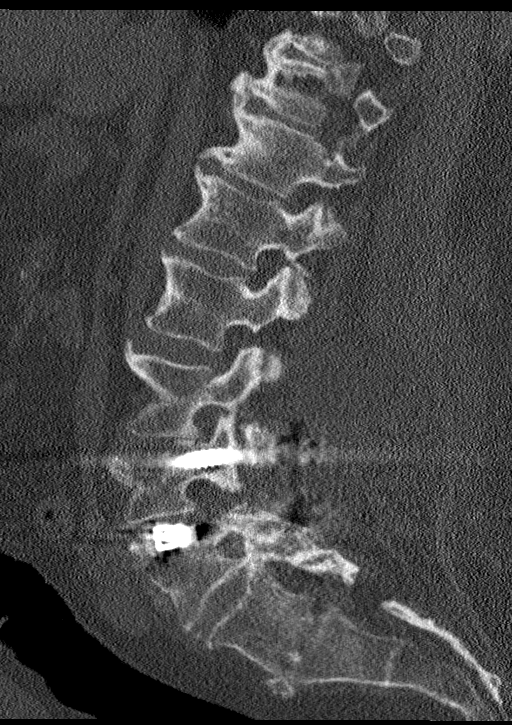
[im 41/81  soft-tissue]
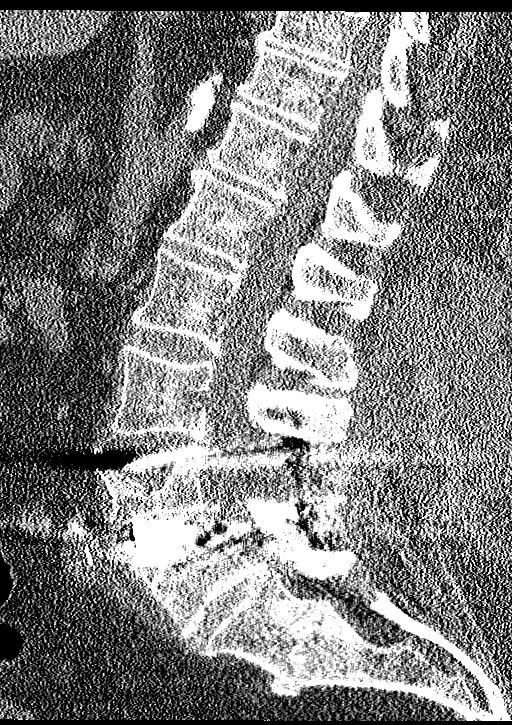
[im 41/81  bone]
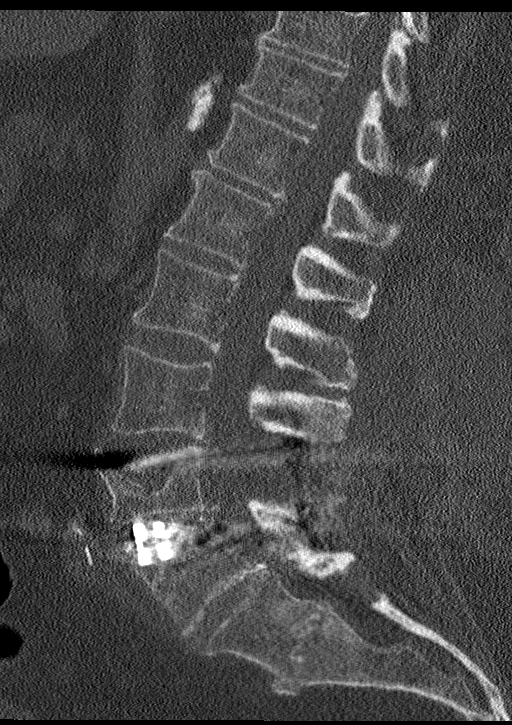
[im 47/81  bone]
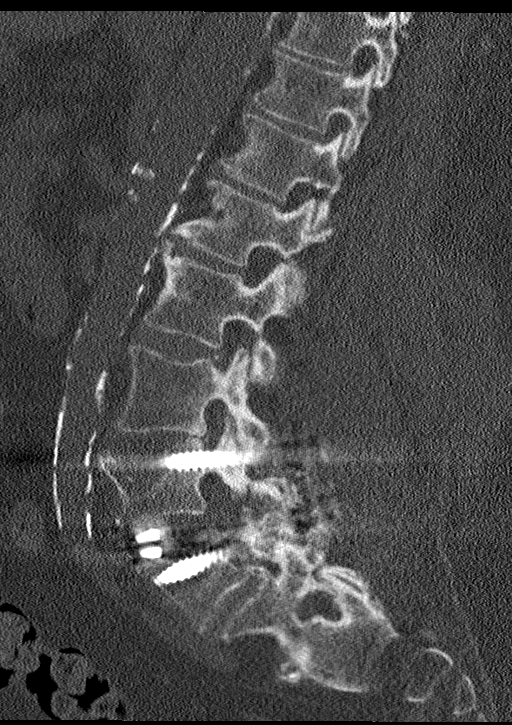
[im 54/81  bone]
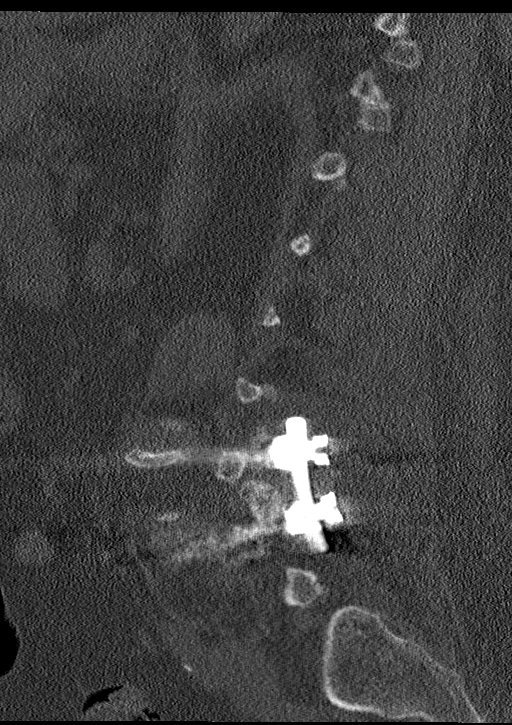

[Series 7: coronal bone · coronal · 0.40mm/px · 3 of 106 slices shown]
[im 22/106  bone]
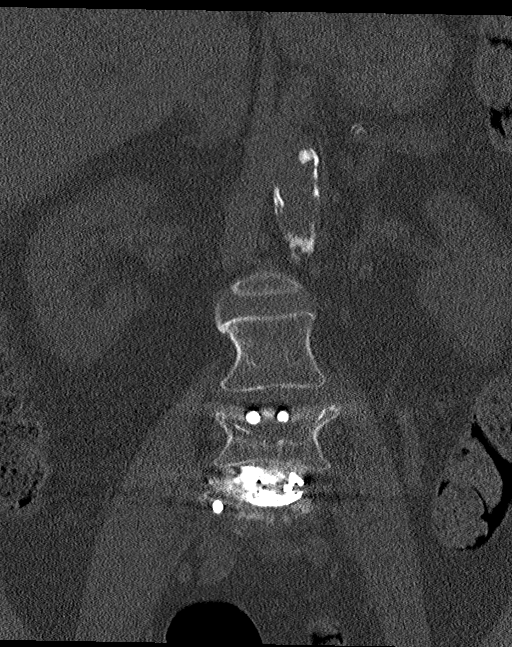
[im 43/106  bone]
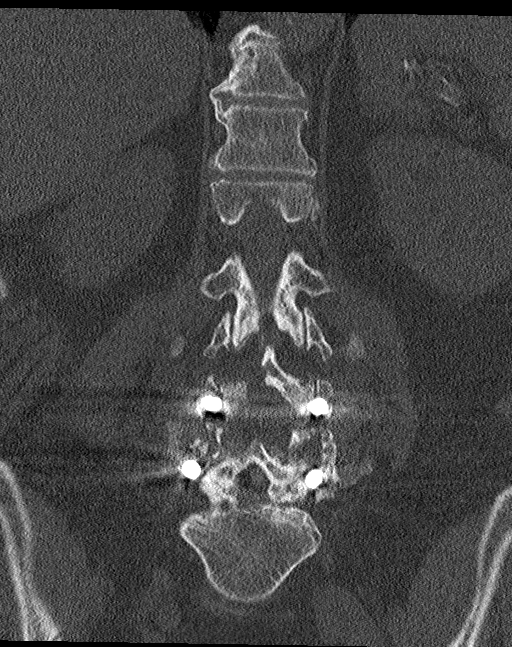
[im 64/106  bone]
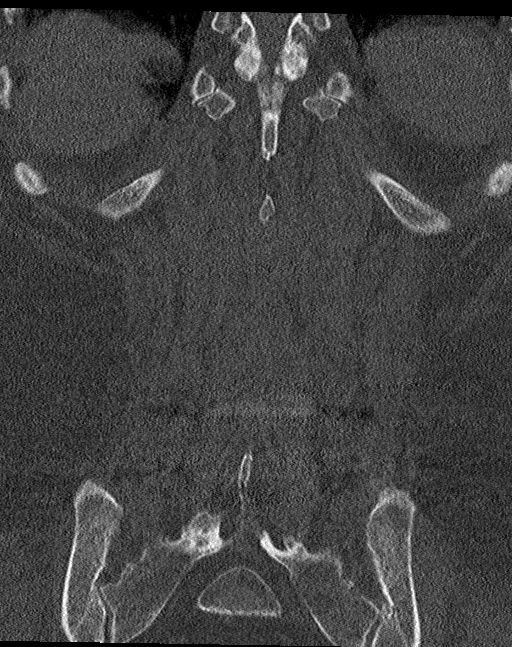

[13 of 33 positions shown; findings below may reference images not displayed]

FINDINGS: Segmentation: There is transitional lumbosacral anatomy. Numbering
is preserved from the prior studies, with the fused levels
considered to be L3-L4.

Alignment: Normal alignment.

Vertebrae: Old superior endplate fracture of L3 is unchanged. There
is PLIF hardware at L3-L4. There is lucency surrounding both L4
screws with irregularity of the lateral vertebral body cortex
(series 4, image 101) on the left.

Paraspinal and other soft tissues: There is calcific aortic
atherosclerosis.

Disc levels: There is no stenosis or large disc herniation of the
disc levels above L2. At L2-L3, there is no spinal canal stenosis or
neural foraminal stenosis. There is mild disc space narrowing and
facet hypertrophy. No residual spinal canal stenosis at L4-L5.
Neural foramina are patent. At L5-S1, there is mild bilateral
foraminal narrowing.
IMPRESSION: 1. L3-L4 PLIF with marked lucency surrounding both L4 transpedicular
screws, with irregularity of the left lateral vertebral body cortex.
With recent surgery and no available prior lumbar spine CT, the
acuity of the findings is uncertain, but given the patient's recent
fall, this is concerning for hardware loosening and fracture of the
lateral vertebral body cortex.
2. No spinal canal stenosis.
3. Mild bilateral L5-S1 neural foraminal narrowing.
4.  Aortic Atherosclerosis (ONT4T-XC4.4).

## 2021-04-09 ENCOUNTER — Other Ambulatory Visit: Payer: Self-pay | Admitting: Orthopedic Surgery

## 2021-04-09 DIAGNOSIS — M25561 Pain in right knee: Secondary | ICD-10-CM

## 2021-04-09 DIAGNOSIS — M1711 Unilateral primary osteoarthritis, right knee: Secondary | ICD-10-CM

## 2021-04-17 ENCOUNTER — Ambulatory Visit
Admission: RE | Admit: 2021-04-17 | Discharge: 2021-04-17 | Disposition: A | Discharge status: Home / Self Care | Payer: 59 | Source: Ambulatory Visit | Attending: Orthopedic Surgery | Admitting: Orthopedic Surgery

## 2021-04-17 DIAGNOSIS — M1711 Unilateral primary osteoarthritis, right knee: Secondary | ICD-10-CM | POA: Insufficient documentation

## 2021-04-17 DIAGNOSIS — M25561 Pain in right knee: Secondary | ICD-10-CM | POA: Diagnosis present

## 2021-09-04 ENCOUNTER — Other Ambulatory Visit: Payer: Self-pay | Admitting: Internal Medicine

## 2021-09-04 DIAGNOSIS — N1832 Chronic kidney disease, stage 3b: Secondary | ICD-10-CM

## 2021-09-15 ENCOUNTER — Ambulatory Visit
Admission: RE | Admit: 2021-09-15 | Discharge: 2021-09-15 | Disposition: A | Discharge status: Home / Self Care | Payer: Medicare Other | Source: Ambulatory Visit | Attending: Internal Medicine | Admitting: Internal Medicine

## 2021-09-15 DIAGNOSIS — N1832 Chronic kidney disease, stage 3b: Secondary | ICD-10-CM

## 2021-11-19 ENCOUNTER — Encounter: Payer: Self-pay | Admitting: Urology

## 2021-11-19 ENCOUNTER — Ambulatory Visit (INDEPENDENT_AMBULATORY_CARE_PROVIDER_SITE_OTHER): Payer: Medicare Other | Admitting: Urology

## 2021-11-19 VITALS — BP 148/68 | HR 78 | Ht <= 58 in | Wt 236.0 lb

## 2021-11-19 DIAGNOSIS — R350 Frequency of micturition: Secondary | ICD-10-CM

## 2021-11-19 DIAGNOSIS — N3281 Overactive bladder: Secondary | ICD-10-CM

## 2021-11-19 DIAGNOSIS — N3941 Urge incontinence: Secondary | ICD-10-CM | POA: Diagnosis not present

## 2021-11-19 DIAGNOSIS — N390 Urinary tract infection, site not specified: Secondary | ICD-10-CM

## 2021-11-19 MED ORDER — MIRABEGRON ER 50 MG PO TB24: 50.0000 mg | ORAL_TABLET | Freq: Every day | ORAL | 0 refills | Status: DC

## 2021-11-19 NOTE — Progress Notes (Signed)
11/19/21 1:04 PM   Michele Mcgee 07-07-1945 [id number]  CC: Urinary urgency, frequency, urge incontinence, recent UTI   HPI: 76 year old female here today with her daughter for the above issues.  Her daughter helps provide much of the history.  She is a very comorbid 76 year old female with morbid obesity and BMI 51 who reports at least a few years of urinary urgency, frequency, and urge incontinence that has worsened over the last few months.  She was treated for a culture positive E. coli UTI on 11/05/2021 with nitrofurantoin with only some mild improvement in the symptoms.  She was also started on oxybutynin at some point, but only took this a few days and reports no significant improvement.  She also takes torsemide which exacerbates her urinary frequency.  She really does not have any problems overnight with urgency or incontinence.  She denies any dysuria or gross hematuria.  Recent renal/bladder ultrasound was normal.  She has a history of a " bladder tack" surgery x 2, once at the time of hysterectomy, and again with Dr. Eliberto Ivory.  She is unsure how long ago that was.  She also has a fair amount of frailty with difficulty getting to the bathroom in time and walks with a cane.  She has had a number of falls.  She reports she voids about every 30 to 45 minutes during the day.  She drinks primarily water but an occasional soda.  She was unable to give a urine sample today as she missed the cup with voiding.  Recent urinalysis though on 11/05/2021 showed no RBCs and was relatively benign, but ultimately grew the E. coli.  Unclear if this represents true infection versus colonization.   PMH: Past Medical History:  Diagnosis Date   Anxiety    Arthritis    Choledocholithiasis    Coronary artery disease    Female bladder prolapse    GERD (gastroesophageal reflux disease)    Hypercholesterolemia    Hypertension    PONV (postoperative nausea and vomiting)    Spondylolisthesis of lumbar region      Surgical History: Past Surgical History:  Procedure Laterality Date   ABDOMINAL HYSTERECTOMY     BACK SURGERY     Bladder Tacking Surgery     COLONOSCOPY     COLONOSCOPY WITH PROPOFOL N/A 12/09/2014   Procedure: COLONOSCOPY WITH PROPOFOL;  Surgeon: Manya Silvas, MD;  Location: [facility];  Service: Endoscopy;  Laterality: N/A;   ERCP     ESOPHAGEAL DILATION     ESOPHAGOGASTRODUODENOSCOPY     LAMINECTOMY     LUMBAR DISC SURGERY  09/14/2017   TUBAL LIGATION     VEIN LIGATION AND STRIPPING      Family History: Family History  Problem Relation Age of Onset   Breast cancer Neg Hx     Social History:  reports that she has never smoked. She has never used smokeless tobacco. She reports that she does not drink alcohol and does not use drugs.  Physical Exam: BP (!) 148/68   Pulse 78   Ht '4\' 9"'$  (1.448 m)   Wt 236 lb (107 kg)   BMI 51.07 kg/m    Constitutional:  Alert and oriented, No acute distress. Cardiovascular: No clubbing, cyanosis, or edema. Respiratory: Normal respiratory effort, no increased work of breathing. GI: Abdomen is soft, nontender, nondistended, no abdominal masses  Laboratory Data: Reviewed in epic  Pertinent Imaging: I have personally viewed and interpreted the renal/bladder ultrasound showing normal-appearing bladder and  kidneys with no hydronephrosis or stones.  Assessment & Plan:   Comorbid 76 year old female with history of " bladder tack" surgery x 2 in the past, long history of overactive bladder with urgency and frequency, with worsening urge incontinence after recent possible UTI.  Not a good candidate for oxybutynin with her age and frailty.  We discussed I think we need to have realistic expectations with her morbid obesity, frailty, and diuretic use.  We discussed that overactive bladder (OAB) is not a disease, but is a symptom complex that is generally not life-threatening.  Symptoms typically include urinary urgency, frequency, and  urge incontinence.  There are numerous treatment options, however there are risks and benefits with both medical and surgical management.  First-line treatment is behavioral therapies including bladder training, pelvic floor muscle training, and fluid management.  Second line treatments include oral antimuscarinics(Ditropan er, Trospium) and beta-3 agonist (Mybetriq). There is typically a period of medication trial (4-8 weeks) to find the optimal therapy and dosing. If symptoms are bothersome despite the above management, third line options include intra-detrusor botox, peripheral tibial nerve stimulation (PTNS), and interstim (SNS). These are more invasive treatments with higher side effect profile, but may improve quality of life for patients with severe OAB symptoms.   Myrbetriq 50 mg daily, samples given Behavioral strategies discussed RTC PA 4 weeks, if persistent symptoms could consider PTNS or evaluation with Dr. Sheran Luz, MD 11/19/2021  [facility] [address] [place], [place] [address] [phone]

## 2021-11-19 NOTE — Patient Instructions (Signed)

## 2021-12-15 ENCOUNTER — Telehealth: Payer: Self-pay | Admitting: Urology

## 2021-12-15 DIAGNOSIS — R350 Frequency of micturition: Secondary | ICD-10-CM

## 2021-12-15 NOTE — Telephone Encounter (Signed)
Patient called and wants to get refill of samples that she was given at 8/10 appt with Dr. Diamantina Providence. She says that medication is working. She has follow up appt with Sam on 9/7 that she wants to cancel. Can she get refill if she cancels appt?

## 2021-12-16 MED ORDER — MIRABEGRON ER 50 MG PO TB24: 50.0000 mg | ORAL_TABLET | Freq: Every day | ORAL | 11 refills | Status: DC

## 2021-12-16 NOTE — Telephone Encounter (Signed)
Called pt no answer. Left detailed message per DPR informing pt that RX was sent into pharmacy, 4wk f/u cancelled. Pt scheduled for 73yrf/u.

## 2021-12-16 NOTE — Telephone Encounter (Signed)
Ok to send rx or keep f/u? Please advise.

## 2021-12-17 ENCOUNTER — Ambulatory Visit: Payer: Medicare Other | Admitting: Physician Assistant

## 2022-02-08 ENCOUNTER — Telehealth: Payer: Self-pay | Admitting: Urology

## 2022-02-08 NOTE — Telephone Encounter (Signed)
Please advise 

## 2022-02-08 NOTE — Telephone Encounter (Signed)
Pt called and wanted to know if she can try a different med, she says Myrbetriq 50 mg once per day isn't working.

## 2022-02-09 ENCOUNTER — Telehealth: Payer: Self-pay | Admitting: Urology

## 2022-02-09 NOTE — Telephone Encounter (Signed)
Per phone note from September the mybetriq was working and she canceled her follow up with PA. Recommend rescheduling follow up with her to discuss alternative medication, likely can try sampels of gemtesa at that visit  Nickolas Madrid, MD 02/09/2022

## 2022-02-09 NOTE — Telephone Encounter (Signed)
Pt states that the medication Myrbetriq is not working. Please advise as to what medication to change to.  [phone]

## 2022-02-09 NOTE — Telephone Encounter (Signed)
Called and spoke with per Dr Diamantina Providence pt will need make an appt come in to discuss changing medication. Made an appt for patient to come for 02/18/22 to see [place], [place]  regarding change medication, pt said that she will call us back if she can't get a ride to her appt.

## 2022-02-18 ENCOUNTER — Ambulatory Visit (INDEPENDENT_AMBULATORY_CARE_PROVIDER_SITE_OTHER): Payer: Medicare Other | Admitting: Urology

## 2022-02-18 VITALS — BP 134/77 | HR 80 | Ht <= 58 in | Wt 232.0 lb

## 2022-02-18 DIAGNOSIS — R350 Frequency of micturition: Secondary | ICD-10-CM

## 2022-02-18 DIAGNOSIS — N3941 Urge incontinence: Secondary | ICD-10-CM

## 2022-02-18 DIAGNOSIS — N3281 Overactive bladder: Secondary | ICD-10-CM | POA: Diagnosis not present

## 2022-02-18 LAB — BLADDER SCAN AMB NON-IMAGING

## 2022-02-18 MED ORDER — GEMTESA 75 MG PO TABS: 75.0000 mg | ORAL_TABLET | Freq: Every day | ORAL | 0 refills | Status: AC

## 2022-02-18 NOTE — Progress Notes (Signed)
02/18/2022 1:33 PM   Michele Mcgee Sep 26, 1945 [id number]  Referring provider: Kirk Ruths, MD [facility] [place],  [place] [address]  Urological history: 1. OAB -contributing factors of age, vaginal atrophy, HTN, spondylolisthesis of lumbar region, depression, obesity, diuretics, constipation, diabetes, sleeping aides and antihistamines -PVR 41 mL  -Myrbetriq 50 mg daily     Chief Complaint  Patient presents with   Urinary Frequency    HPI: Michele Mcgee is a 76 y.o. female who presents today for Myrbetriq medication not working with her daughter, Michele Mcgee.  On her OAB questionnaire, she indicates that she is having to make 1 to 7 daytime trips to the restroom during the day, 1 to 2 night time urination's and mild urge to urinate.  She is having urge incontinence 3 or more times daily and wearing 1 to 4 pads daily.   She is having "foot on the floor" incontinence in the am.  She engages in toilet mapping.   Patient denies any modifying or aggravating factors.  Patient denies any gross hematuria, dysuria or suprapubic/flank pain.  Patient denies any fevers, chills, nausea or vomiting.    PVR 41 mL.    She states that when she started the Myrbetriq, it worked for one week and then it stopped working.  She is having one cup of coffee, one glass of orange juice, an occasional diet Coke, but she mostly drinks water.    PMH: Past Medical History:  Diagnosis Date   Anxiety    Arthritis    Choledocholithiasis    Coronary artery disease    Female bladder prolapse    GERD (gastroesophageal reflux disease)    Hypercholesterolemia    Hypertension    PONV (postoperative nausea and vomiting)    Spondylolisthesis of lumbar region     Surgical History: Past Surgical History:  Procedure Laterality Date   ABDOMINAL HYSTERECTOMY     BACK SURGERY     Bladder Tacking Surgery     COLONOSCOPY     COLONOSCOPY WITH PROPOFOL N/A 12/09/2014    Procedure: COLONOSCOPY WITH PROPOFOL;  Surgeon: Manya Silvas, MD;  Location: [facility];  Service: Endoscopy;  Laterality: N/A;   ERCP     ESOPHAGEAL DILATION     ESOPHAGOGASTRODUODENOSCOPY     LAMINECTOMY     LUMBAR DISC SURGERY  09/14/2017   TUBAL LIGATION     VEIN LIGATION AND STRIPPING      Home Medications:  Allergies as of 02/18/2022       Reactions   Oxycodone    Ampicillin Rash   Has patient had a PCN reaction causing immediate rash, facial/tongue/throat swelling, SOB or lightheadedness with hypotension: Rash Has patient had a PCN reaction causing severe rash involving mucus membranes or skin necrosis: No Has patient had a PCN reaction that required hospitalization: No Has patient had a PCN reaction occurring within the last 10 years: No If all of the above answers are "NO", then may proceed with Cephalosporin use.        Medication List        Accurate as of February 18, 2022 11:59 PM. If you have any questions, ask your nurse or doctor.          STOP taking these medications    mirabegron ER 50 MG Tb24 tablet Commonly known as: MYRBETRIQ       TAKE these medications    acetaminophen 325 MG tablet Commonly known as:  TYLENOL Take 650 mg by mouth every 4 (four) hours as needed for mild pain or moderate pain.   atorvastatin 40 MG tablet Commonly known as: LIPITOR Take 40 mg by mouth every evening.   Calcium 600+D3 600-400 MG-UNIT Tabs Generic drug: Calcium Carbonate-Vitamin D3 Take 1 tablet by mouth 2 (two) times daily.   Centrum Silver 50+Women Tabs Take 1 tablet by mouth daily.   Cholecalciferol 50 MCG (2000 UT) Tabs Take by mouth.   escitalopram 20 MG tablet Commonly known as: LEXAPRO Take 20 mg by mouth daily.   esomeprazole 40 MG capsule Commonly known as: NEXIUM Take 40 mg by mouth daily before breakfast. 30 minutes to 1 hour prior to breakfast   fluticasone 50 MCG/ACT nasal spray Commonly known as: FLONASE Place into the  nose.   gabapentin 100 MG capsule Commonly known as: NEURONTIN Take 100 mg by mouth 2 (two) times daily.   Gemtesa 75 MG Tabs Generic drug: Vibegron Take 75 mg by mouth daily.   glycerin adult 2 g suppository Place 1 suppository rectally as needed for constipation.   lisinopril-hydrochlorothiazide 10-12.5 MG tablet Commonly known as: ZESTORETIC Take 1 tablet by mouth daily.   metFORMIN 500 MG tablet Commonly known as: GLUCOPHAGE Take 500 mg by mouth 2 (two) times daily.   metoprolol succinate 25 MG 24 hr tablet Commonly known as: TOPROL-XL Take 25 mg by mouth daily.   Peri-Colace 8.6-50 MG tablet Generic drug: senna-docusate Take 2 tablets by mouth 2 (two) times daily.   polyethylene glycol 17 g packet Commonly known as: MIRALAX / GLYCOLAX Take 17 g by mouth daily.   potassium chloride 10 MEQ tablet Commonly known as: KLOR-CON Take 10 mEq by mouth daily.   torsemide 20 MG tablet Commonly known as: DEMADEX Take 20 mg by mouth daily as needed (for swelling in feet).   traMADol 50 MG tablet Commonly known as: ULTRAM Take by mouth every 6 (six) hours as needed for moderate pain.   zolpidem 5 MG tablet Commonly known as: AMBIEN Take 1 tablet (5 mg total) by mouth at bedtime as needed for sleep.        Allergies:  Allergies  Allergen Reactions   Oxycodone    Ampicillin Rash    Has patient had a PCN reaction causing immediate rash, facial/tongue/throat swelling, SOB or lightheadedness with hypotension: Rash Has patient had a PCN reaction causing severe rash involving mucus membranes or skin necrosis: No Has patient had a PCN reaction that required hospitalization: No Has patient had a PCN reaction occurring within the last 10 years: No If all of the above answers are "NO", then may proceed with Cephalosporin use.     Family History: Family History  Problem Relation Age of Onset   Breast cancer Neg Hx     Social History:  reports that she has never  smoked. She has never used smokeless tobacco. She reports that she does not drink alcohol and does not use drugs.  ROS: Pertinent ROS in HPI  Physical Exam: BP 134/77   Pulse 80   Ht _0  (1.448 m)   Wt 232 lb (105.2 kg)   BMI 50.20 kg/m   Constitutional:  Well nourished. Alert and oriented, No acute distress. HEENT: [place] AT, moist mucus membranes.  Trachea midline Cardiovascular: No clubbing, cyanosis, or edema. Respiratory: Normal respiratory effort, no increased work of breathing. Neurologic: Grossly intact, no focal deficits, moving all 4 extremities. Psychiatric: Normal mood and affect.    Laboratory Data: Glucose  70 - 110 mg/dL 124 High   Sodium 136 - 145 mmol/L 137  Potassium 3.6 - 5.1 mmol/L 4.0  Chloride 97 - 109 mmol/L 98  Carbon Dioxide (CO2) 22.0 - 32.0 mmol/L 33.6 High   Calcium 8.7 - 10.3 mg/dL 9.2  Urea Nitrogen (BUN) 7 - 25 mg/dL 26 High   Creatinine 0.6 - 1.1 mg/dL 1.0  Glomerular Filtration Rate (eGFR) >60 mL/min/1.73sq m 58 Low   Comment: CKD-EPI (2021) does not include patient's race in the calculation of eGFR.  Monitoring changes of plasma creatinine and eGFR over time is useful for monitoring kidney function.  Interpretive Ranges for eGFR (CKD-EPI 2021):  eGFR:       >60 mL/min/1.73 sq. m - Normal eGFR:       30-59 mL/min/1.73 sq. m - Moderately Decreased eGFR:       15-29 mL/min/1.73 sq. m  - Severely Decreased eGFR:       < 15 mL/min/1.73 sq. m  - Kidney Failure   Note: These eGFR calculations do not apply in acute situations when eGFR is changing rapidly or patients on dialysis.  BUN/Crea Ratio 6.0 - 20.0 26.0 High   Anion Gap w/K 6.0 - 16.0 9.4  Resulting Agency  [facility] - [facility]   Specimen Collected: 01/27/22 08:29   Performed by: [facility] Last Resulted: 01/27/22 14:33  Received From: [facility]  Result Received: 02/08/22 11:10   Hemoglobin A1C 4.2 - 5.6 % 6.4 High   Average Blood Glucose (Calc)  mg/dL [facility]  Narrative Performed by [facility] - [facility] Normal Range:    4.2 - 5.6% Increased Risk:  5.7 - 6.4% Diabetes:        >= 6.5% Glycemic Control for adults with diabetes:  <7%    Specimen Collected: 01/27/22 08:29   Performed by: [facility]: 01/27/22 10:50  Received From: [facility]  Result Received: 02/08/22 11:10   Color Colorless, Straw, Light Yellow, Yellow, Dark Yellow Colorless  Clarity Clear Clear  Specific Gravity 1.005 - 1.030 1.006  pH, Urine 5.0 - 8.0 5.0  Protein, Urinalysis Negative mg/dL Negative  Glucose, Urinalysis Negative mg/dL Negative  Ketones, Urinalysis Negative mg/dL Negative  Blood, Urinalysis Negative Negative  Nitrite, Urinalysis Negative Negative  Leukocyte Esterase, Urinalysis Negative Negative  Bilirubin, Urinalysis Negative Negative  Urobilinogen, Urinalysis 0.2 - 1.0 mg/dL 0.2  WBC, UA <=5 /hpf 0  Red Blood Cells, Urinalysis <=3 /hpf 0  Bacteria, Urinalysis 0 - 5 /hpf 0-5  Squamous Epithelial Cells, Urinalysis /hpf 0  Resulting Agency  [facility] - [facility]   Specimen Collected: 01/15/22 14:49   Performed by: [facility]: 01/15/22 15:26  Received From: [facility]  Result Received: 02/08/22 11:10  I have reviewed the labs.   Pertinent Imaging:  02/18/22 14:51  Scan Result 74m    Assessment & Plan:    1. OAB - Bladder Scan (Post Void Residual) in office -PVR demonstrates adequate bladder emptying -recent UA's benign -Myrbetriq no long effective -discussed trying another OAB agent along with other treatments of PTNS, Interstim and/or Botox -she would like a trial of another OAB -samples of Gemtesa 75 mg, #42  2. Urge incontinence -see #1   Return in about 1 month (around 03/20/2022) for PVR and OAB questionnaire.  These notes generated with voice recognition software. I  apologize for typographical errors.  Royden Purl  [place] [address]  [place],  [address] [phone]   I spent 30 minutes on the day of the encounter to include pre-visit record review, face-to-face time with the patient, and post-visit ordering of tests.

## 2022-02-21 ENCOUNTER — Encounter: Payer: Self-pay | Admitting: Urology

## 2022-03-23 ENCOUNTER — Ambulatory Visit: Payer: Medicare Other | Admitting: Urology

## 2022-04-22 ENCOUNTER — Ambulatory Visit (INDEPENDENT_AMBULATORY_CARE_PROVIDER_SITE_OTHER): Payer: 59 | Admitting: Physician Assistant

## 2022-04-22 VITALS — BP 139/76 | HR 92

## 2022-04-22 DIAGNOSIS — N3281 Overactive bladder: Secondary | ICD-10-CM

## 2022-04-22 DIAGNOSIS — R3915 Urgency of urination: Secondary | ICD-10-CM | POA: Diagnosis not present

## 2022-04-22 DIAGNOSIS — R350 Frequency of micturition: Secondary | ICD-10-CM | POA: Diagnosis not present

## 2022-04-22 DIAGNOSIS — R32 Unspecified urinary incontinence: Secondary | ICD-10-CM

## 2022-04-22 DIAGNOSIS — N390 Urinary tract infection, site not specified: Secondary | ICD-10-CM

## 2022-04-22 LAB — URINALYSIS, COMPLETE
Bilirubin, UA: NEGATIVE
Glucose, UA: NEGATIVE
Ketones, UA: NEGATIVE
Nitrite, UA: NEGATIVE
Protein,UA: NEGATIVE
RBC, UA: NEGATIVE
Specific Gravity, UA: 1.01 (ref 1.005–1.030)
Urobilinogen, Ur: 0.2 mg/dL (ref 0.2–1.0)
pH, UA: 5 (ref 5.0–7.5)

## 2022-04-22 LAB — BLADDER SCAN AMB NON-IMAGING: Scan Result: 15

## 2022-04-22 LAB — MICROSCOPIC EXAMINATION

## 2022-04-22 MED ORDER — SULFAMETHOXAZOLE-TRIMETHOPRIM 800-160 MG PO TABS: 1.0000 | ORAL_TABLET | Freq: Two times a day (BID) | ORAL | 0 refills | Status: AC

## 2022-04-22 NOTE — Progress Notes (Signed)
04/22/2022 10:49 AM   Michele Mcgee 13-Apr-1945 [id number]  CC: Chief Complaint  Patient presents with   Urinary Frequency   HPI: Michele Mcgee is a 77 y.o. female with PMH diabetes, cystocele s/p "bladder tack" x 2, OAB who previously failed Myrbetriq and was started on Gemtesa 2 months ago by Zara Council, and recent positive urine cultures who presents today for evaluation of possible UTI.  He is accompanied today by her daughter, who contributes to HPI.  Today she reports a 7-day history of progressively worsening urgency and frequency over baseline as well as increased urinary incontinence.  She has some occasional burning, but clarifies that this is vulvar when her urine hits her skin.  Her current symptoms have been occurring within the greater context of being treated for 4 possible UTIs by her PCP over the past 4-6 months.  They report that her UAs tend to be bland, but her urine cultures will then grow E. coli.  Her urinary symptoms typically are the same with increased lower abdominal pressure, urgency, and burning on the skin.  She states that these are improved with antibiotics and typically recur within 2 to 3 weeks.  She admits that she often has difficulty obtaining urine specimens and that at her PCP she frequently will void into a hat, for which her urine sample is collected.  She took 6 weeks of Gemtesa as given by Larene Beach, but these did not improve her urinary symptoms.  She has started cranberry pills for UTI prevention.  Overall, she is not particularly bothered by her OAB symptoms and wishes to defer further pharmacotherapy at this time.  Per chart review, she has 4 recent positive urine cultures dating back to 09/03/2021.  Her UA is overall are rather bland, but she may have mild pyuria occasionally.  Urine culture data as below.  09/03/2021: Augmentin and cefuroxime resistant Klebsiella aerogenes 11/05/2021: Pansensitive E. coli 01/15/2022: Cefuroxime intermediate  E. coli 03/26/2022: Pansensitive E. coli  In-office UA today positive for trace leukocytes; urine microscopy with 11-30 WBCs/HPF and many bacteria. PVR 2m.  PMH: Past Medical History:  Diagnosis Date   Anxiety    Arthritis    Choledocholithiasis    Coronary artery disease    Female bladder prolapse    GERD (gastroesophageal reflux disease)    Hypercholesterolemia    Hypertension    PONV (postoperative nausea and vomiting)    Spondylolisthesis of lumbar region     Surgical History: Past Surgical History:  Procedure Laterality Date   ABDOMINAL HYSTERECTOMY     BACK SURGERY     Bladder Tacking Surgery     COLONOSCOPY     COLONOSCOPY WITH PROPOFOL N/A 12/09/2014   Procedure: COLONOSCOPY WITH PROPOFOL;  Surgeon: RManya Silvas MD;  Location: [facility]  Service: Endoscopy;  Laterality: N/A;   ERCP     ESOPHAGEAL DILATION     ESOPHAGOGASTRODUODENOSCOPY     LAMINECTOMY     LUMBAR DISC SURGERY  09/14/2017   TUBAL LIGATION     VEIN LIGATION AND STRIPPING      Home Medications:  Allergies as of 04/22/2022       Reactions   Oxycodone    Ampicillin Rash   Has patient had a PCN reaction causing immediate rash, facial/tongue/throat swelling, SOB or lightheadedness with hypotension: Rash Has patient had a PCN reaction causing severe rash involving mucus membranes or skin necrosis: No Has patient had a PCN reaction that required hospitalization: No Has patient had  a PCN reaction occurring within the last 10 years: No If all of the above answers are "NO", then may proceed with Cephalosporin use.        Medication List        Accurate as of April 22, 2022 10:49 AM. If you have any questions, ask your nurse or doctor.          STOP taking these medications    gabapentin 100 MG capsule Commonly known as: NEURONTIN Stopped by: Debroah Loop, PA-C   glycerin adult 2 g suppository Stopped by: Debroah Loop, PA-C   Peri-Colace 8.6-50 MG  tablet Generic drug: senna-docusate Stopped by: Debroah Loop, PA-C   polyethylene glycol 17 g packet Commonly known as: MIRALAX / GLYCOLAX Stopped by: Debroah Loop, PA-C       TAKE these medications    acetaminophen 325 MG tablet Commonly known as: TYLENOL Take 650 mg by mouth every 4 (four) hours as needed for mild pain or moderate pain.   atorvastatin 40 MG tablet Commonly known as: LIPITOR Take 40 mg by mouth every evening.   Calcium 600+D3 600-400 MG-UNIT Tabs Generic drug: Calcium Carbonate-Vitamin D3 Take 1 tablet by mouth 2 (two) times daily.   Centrum Silver 50+Women Tabs Take 1 tablet by mouth daily.   Cholecalciferol 50 MCG (2000 UT) Tabs Take by mouth.   escitalopram 20 MG tablet Commonly known as: LEXAPRO Take 20 mg by mouth daily.   esomeprazole 40 MG capsule Commonly known as: NEXIUM Take 40 mg by mouth daily before breakfast. 30 minutes to 1 hour prior to breakfast   fluticasone 50 MCG/ACT nasal spray Commonly known as: FLONASE Place into the nose.   Gemtesa 75 MG Tabs Generic drug: Vibegron Take 75 mg by mouth daily.   lisinopril-hydrochlorothiazide 10-12.5 MG tablet Commonly known as: ZESTORETIC Take 1 tablet by mouth daily.   metFORMIN 500 MG tablet Commonly known as: GLUCOPHAGE Take 500 mg by mouth 2 (two) times daily.   metoprolol succinate 25 MG 24 hr tablet Commonly known as: TOPROL-XL Take 25 mg by mouth daily.   potassium chloride 10 MEQ tablet Commonly known as: KLOR-CON Take 10 mEq by mouth daily.   sulfamethoxazole-trimethoprim 800-160 MG tablet Commonly known as: BACTRIM DS Take 1 tablet by mouth 2 (two) times daily for 5 days. Started by: Debroah Loop, PA-C   torsemide 20 MG tablet Commonly known as: DEMADEX Take 20 mg by mouth daily as needed (for swelling in feet).   traMADol 50 MG tablet Commonly known as: ULTRAM Take by mouth every 6 (six) hours as needed for moderate pain.    zolpidem 5 MG tablet Commonly known as: AMBIEN Take 1 tablet (5 mg total) by mouth at bedtime as needed for sleep.        Allergies:  Allergies  Allergen Reactions   Oxycodone    Ampicillin Rash    Has patient had a PCN reaction causing immediate rash, facial/tongue/throat swelling, SOB or lightheadedness with hypotension: Rash Has patient had a PCN reaction causing severe rash involving mucus membranes or skin necrosis: No Has patient had a PCN reaction that required hospitalization: No Has patient had a PCN reaction occurring within the last 10 years: No If all of the above answers are "NO", then may proceed with Cephalosporin use.     Family History: Family History  Problem Relation Age of Onset   Breast cancer Neg Hx     Social History:   reports that she has never smoked. She has  never used smokeless tobacco. She reports that she does not drink alcohol and does not use drugs.  Physical Exam: BP 139/76   Pulse 92   Constitutional:  Alert and oriented, no acute distress, nontoxic appearing HEENT: [place], AT Cardiovascular: No clubbing, cyanosis, or edema Respiratory: Normal respiratory effort, no increased work of breathing Skin: No rashes, bruises or suspicious lesions Neurologic: Grossly intact, no focal deficits, moving all 4 extremities Psychiatric: Normal mood and affect  Laboratory Data: Results for orders placed or performed in visit on 04/22/22  Microscopic Examination   Urine  Result Value Ref Range   WBC, UA 11-30 (A) 0 - 5 /hpf   RBC, Urine 0-2 0 - 2 /hpf   Epithelial Cells (non renal) 0-10 0 - 10 /hpf   Bacteria, UA Many (A) None seen/Few  Urinalysis, Complete  Result Value Ref Range   Specific Gravity, UA 1.010 1.005 - 1.030   pH, UA 5.0 5.0 - 7.5   Color, UA Yellow Yellow   Appearance Ur Clear Clear   Leukocytes,UA Trace (A) Negative   Protein,UA Negative Negative/Trace   Glucose, UA Negative Negative   Ketones, UA Negative Negative   RBC, UA  Negative Negative   Bilirubin, UA Negative Negative   Urobilinogen, Ur 0.2 0.2 - 1.0 mg/dL   Nitrite, UA Negative Negative   Microscopic Examination See below:   Bladder Scan (Post Void Residual) in office  Result Value Ref Range   Scan Result 15 ml    Assessment & Plan:   1. Recurrent UTI Acute onset of worsening urgency, frequency, and incontinence of her baseline within the past week.  Her UA is notable for pyuria and bacteriuria consistent with acute cystitis.  Will start empiric Bactrim and send for culture for further evaluation.  She is emptying appropriately.  We had a lengthy conversation today regarding the difference between recurrent UTI and urinary colonization.  She admits that she has a difficult time obtaining urine samples, so I think her "recurrent UTIs" may in fact represent urinary colonization and/or urine sample contamination.  I asked her to start seeing me in clinic with recurrent symptoms and we will start obtaining cath UAs on her to get better data around her symptoms.  With reports of vulvar burning, I think she may ultimately benefit from vaginal estrogen cream, however I prefer to defer this pending additional data regarding her urinary symptoms.  We also discussed genitourinary syndrome of menopause and the anticipated changes around the bladder and vagina in response to lost estrogen in menopause. - Urinalysis, Complete - Bladder Scan (Post Void Residual) in office - CULTURE, URINE COMPREHENSIVE - sulfamethoxazole-trimethoprim (BACTRIM DS) 800-160 MG tablet; Take 1 tablet by mouth 2 (two) times daily for 5 days.  Dispense: 10 tablet; Refill: 0  2. OAB (overactive bladder) She has failed beta 3 agonists.  She is not particularly bothered by her symptoms and prefers to defer additional pharmacotherapy at this time, which is reasonable.  We discussed that her OAB symptoms may fluctuate and may be complicating the picture of her possible recurrent UTIs as above.  She  understands to expect worsening OAB symptoms on the days she takes diuretics.  May consider third line therapies in the future per patient preference.  I do not recommend pursuing anticholinergics due to age.  Return if symptoms worsen or fail to improve.  Debroah Loop, PA-C  [facility] [address], [place], [place] [address] [phone]

## 2022-04-26 LAB — CULTURE, URINE COMPREHENSIVE

## 2022-11-25 ENCOUNTER — Ambulatory Visit: Payer: 59 | Admitting: Urology

## 2023-09-15 ENCOUNTER — Ambulatory Visit
Admission: RE | Admit: 2023-09-15 | Discharge: 2023-09-15 | Disposition: A | Discharge status: Home / Self Care | Source: Ambulatory Visit | Attending: Internal Medicine | Admitting: Internal Medicine

## 2023-09-15 ENCOUNTER — Other Ambulatory Visit: Payer: Self-pay | Admitting: Internal Medicine

## 2023-09-15 DIAGNOSIS — R270 Ataxia, unspecified: Secondary | ICD-10-CM | POA: Insufficient documentation

## 2023-09-16 ENCOUNTER — Encounter: Payer: Self-pay | Admitting: Internal Medicine

## 2023-09-16 DIAGNOSIS — R270 Ataxia, unspecified: Secondary | ICD-10-CM

## 2024-03-01 ENCOUNTER — Other Ambulatory Visit: Payer: Self-pay | Admitting: Orthopedic Surgery

## 2024-03-01 DIAGNOSIS — M1711 Unilateral primary osteoarthritis, right knee: Secondary | ICD-10-CM

## 2024-03-15 ENCOUNTER — Ambulatory Visit
Admission: RE | Admit: 2024-03-15 | Discharge: 2024-03-15 | Disposition: A | Source: Ambulatory Visit | Attending: Orthopedic Surgery | Admitting: Orthopedic Surgery

## 2024-03-15 DIAGNOSIS — M1711 Unilateral primary osteoarthritis, right knee: Secondary | ICD-10-CM | POA: Diagnosis present
